# Patient Record
Sex: Male | Born: 1970 | Race: White | Hispanic: No | Marital: Married | Smoking: Current every day smoker
Health system: Southern US, Community
[De-identification: ages and names within clinical notes are randomized; demographics above are authoritative.]

## PROBLEM LIST (undated history)

## (undated) DIAGNOSIS — K219 Gastro-esophageal reflux disease without esophagitis: Secondary | ICD-10-CM

## (undated) DIAGNOSIS — M1990 Unspecified osteoarthritis, unspecified site: Secondary | ICD-10-CM

## (undated) DIAGNOSIS — J189 Pneumonia, unspecified organism: Secondary | ICD-10-CM

## (undated) DIAGNOSIS — E7800 Pure hypercholesterolemia, unspecified: Secondary | ICD-10-CM

## (undated) HISTORY — PX: APPENDECTOMY: SHX54

## (undated) HISTORY — DX: Pure hypercholesterolemia, unspecified: E78.00

## (undated) HISTORY — DX: Gastro-esophageal reflux disease without esophagitis: K21.9

## (undated) HISTORY — PX: OTHER SURGICAL HISTORY: SHX169

---

## 2007-10-14 ENCOUNTER — Ambulatory Visit (HOSPITAL_COMMUNITY): Admission: RE | Admit: 2007-10-14 | Discharge: 2007-10-14 | Payer: Self-pay | Admitting: Family Medicine

## 2007-11-11 ENCOUNTER — Ambulatory Visit (HOSPITAL_COMMUNITY): Admission: RE | Admit: 2007-11-11 | Discharge: 2007-11-11 | Payer: Self-pay | Admitting: Internal Medicine

## 2009-02-04 ENCOUNTER — Ambulatory Visit (HOSPITAL_COMMUNITY): Admission: RE | Admit: 2009-02-04 | Discharge: 2009-02-04 | Payer: Self-pay | Admitting: Family Medicine

## 2011-03-13 ENCOUNTER — Ambulatory Visit: Payer: Self-pay | Admitting: Physical Medicine & Rehabilitation

## 2011-03-13 ENCOUNTER — Encounter: Payer: 59 | Attending: Physical Medicine & Rehabilitation

## 2011-03-13 DIAGNOSIS — M5137 Other intervertebral disc degeneration, lumbosacral region: Secondary | ICD-10-CM

## 2011-03-13 DIAGNOSIS — M542 Cervicalgia: Secondary | ICD-10-CM | POA: Insufficient documentation

## 2011-03-13 DIAGNOSIS — Z79899 Other long term (current) drug therapy: Secondary | ICD-10-CM | POA: Insufficient documentation

## 2011-03-13 DIAGNOSIS — M545 Low back pain, unspecified: Secondary | ICD-10-CM | POA: Insufficient documentation

## 2011-03-13 DIAGNOSIS — F17200 Nicotine dependence, unspecified, uncomplicated: Secondary | ICD-10-CM | POA: Insufficient documentation

## 2011-03-13 DIAGNOSIS — M533 Sacrococcygeal disorders, not elsewhere classified: Secondary | ICD-10-CM

## 2011-03-17 NOTE — Consult Note (Signed)
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 He has a secondary complaint of neck pain.  He has been evaluated by Neurosurgery, they have done some type of injection where he had a single shot in his back on several occasions, which was not helpful for him.  Surgery was discussed but not strongly endorsed.  He has seen a chiropractor which has provided him some temporary relief.  He complains of 8/10 pain, which is constant.  He states that his pain has worsened with prolonged sitting.  He tries to get by without medications, in fact he does not even take one hydrocodone per day, he may take a half or one couple of days a week when his pain gets really bad while driving.  He does not seem to have any drowsiness with this medication.  He complains of numbness, weakness, tingling, spasms, easy bleeding, limb swelling, sleep apnea.  He has a past history significant for ulcers, hyperlipidemia.  CURRENT MEDICATIONS:  Hydrocodone 10/500, he takes less than 1 tablet per day, he filled these on February 06, 2011, has 47 left, meloxicam 15 mg a day, Nexium 40 mg a day, Ambien 10 mg p.r.n., cyclobenzaprine 10 mg at bedtime p.r.n., simvastatin 20 mg per day.  He takes additional supplements including potassium, garlic, vitamin D, fish oil, and multivitamin.  Alcohol use of 12-pack per week.  He smokes one half packs per day. Lives with his wife and children.  No illegal drug use reported.  FAMILY HISTORY:  Heart disease, diabetes.  FAMILY HISTORY:  Leukemia.  PHYSICAL EXAMINATION:  VITAL SIGNS:  Blood pressure, 21/67, pulse 86, respirations 18, O2 sat 97% on room air. GENERAL:  Well-developed, well-nourished male in no acute distress. Orientation x3.  Affect alert.  Gait is normal. EXTREMITIES:  Without edema. NEUROLOGIC:  Coordination normal.  Deep tendon reflexes normal in upper and lower  extremities.  Sensation is normal in the upper and lower extremities.  Neck range of motion is full and his lumbar spine range of motion is about 50% range forward flexion, 75% extension, 75% lateral rotation and bending.  His worse position is forward flexed causing the most pain.  Straight leg raising test is negative.  Hip, knee, and ankle range of motion are full.  He does have positive FABER bilaterally. Referring pain towards the PSIS.  IMPRESSION:  Lumbar pain, likely multifactorial.  No radicular component, therefore epidural steroid injections are likely to fail.  I do think he has disk related pain but also sacroiliac mediated pain.  PLAN: 1. We will send to therapy for lumbar extension exercises and     stretching. 2. The patient has TENS home unit, can continue that. 3. Send him off for sacroiliac injections under fluoroscopic guidance     bilateral. 4. Continue current medications.  Discussed with the patient, agrees     with plan.     Erick Colace, M.D. Electronically Signed    AEK/MedQ D:03/13/2011 15:17:49  T:03/14/2011 00:54:15  Job #:  [id number]  cc:   Corrie Mckusick, M.D. Fax: [phone]

## 2011-03-30 ENCOUNTER — Ambulatory Visit (HOSPITAL_COMMUNITY): Payer: 59 | Admitting: Physical Therapy

## 2011-04-16 ENCOUNTER — Ambulatory Visit: Payer: 59 | Admitting: Physical Medicine & Rehabilitation

## 2011-04-20 ENCOUNTER — Ambulatory Visit: Payer: 59

## 2011-04-20 ENCOUNTER — Ambulatory Visit: Payer: 59 | Admitting: Physical Medicine & Rehabilitation

## 2011-04-20 ENCOUNTER — Encounter: Payer: 59 | Attending: Physical Medicine & Rehabilitation

## 2011-04-20 DIAGNOSIS — M542 Cervicalgia: Secondary | ICD-10-CM | POA: Insufficient documentation

## 2011-04-20 DIAGNOSIS — M545 Low back pain, unspecified: Secondary | ICD-10-CM | POA: Insufficient documentation

## 2011-04-20 DIAGNOSIS — F17200 Nicotine dependence, unspecified, uncomplicated: Secondary | ICD-10-CM | POA: Insufficient documentation

## 2011-04-20 DIAGNOSIS — Z79899 Other long term (current) drug therapy: Secondary | ICD-10-CM | POA: Insufficient documentation

## 2012-09-15 ENCOUNTER — Other Ambulatory Visit (HOSPITAL_COMMUNITY): Payer: Self-pay | Admitting: Physical Medicine and Rehabilitation

## 2012-09-15 DIAGNOSIS — IMO0002 Reserved for concepts with insufficient information to code with codable children: Secondary | ICD-10-CM

## 2012-09-15 DIAGNOSIS — M545 Low back pain: Secondary | ICD-10-CM

## 2012-09-19 ENCOUNTER — Ambulatory Visit (HOSPITAL_COMMUNITY)
Admission: RE | Admit: 2012-09-19 | Discharge: 2012-09-19 | Disposition: A | Discharge status: Home / Self Care | Payer: 59 | Source: Ambulatory Visit | Attending: Physical Medicine and Rehabilitation | Admitting: Physical Medicine and Rehabilitation

## 2012-09-19 ENCOUNTER — Other Ambulatory Visit (HOSPITAL_COMMUNITY): Payer: Self-pay | Admitting: Physical Medicine and Rehabilitation

## 2012-09-19 DIAGNOSIS — M545 Low back pain, unspecified: Secondary | ICD-10-CM | POA: Insufficient documentation

## 2012-09-19 DIAGNOSIS — M5126 Other intervertebral disc displacement, lumbar region: Secondary | ICD-10-CM | POA: Insufficient documentation

## 2012-09-19 DIAGNOSIS — Z01818 Encounter for other preprocedural examination: Secondary | ICD-10-CM

## 2012-09-19 DIAGNOSIS — IMO0002 Reserved for concepts with insufficient information to code with codable children: Secondary | ICD-10-CM | POA: Insufficient documentation

## 2012-09-19 DIAGNOSIS — R209 Unspecified disturbances of skin sensation: Secondary | ICD-10-CM | POA: Insufficient documentation

## 2014-05-23 ENCOUNTER — Encounter (INDEPENDENT_AMBULATORY_CARE_PROVIDER_SITE_OTHER): Payer: Self-pay | Admitting: *Deleted

## 2014-05-25 ENCOUNTER — Encounter (INDEPENDENT_AMBULATORY_CARE_PROVIDER_SITE_OTHER): Payer: Self-pay | Admitting: Internal Medicine

## 2014-05-25 ENCOUNTER — Telehealth (INDEPENDENT_AMBULATORY_CARE_PROVIDER_SITE_OTHER): Payer: Self-pay | Admitting: *Deleted

## 2014-05-25 ENCOUNTER — Other Ambulatory Visit (INDEPENDENT_AMBULATORY_CARE_PROVIDER_SITE_OTHER): Payer: Self-pay | Admitting: *Deleted

## 2014-05-25 ENCOUNTER — Ambulatory Visit (INDEPENDENT_AMBULATORY_CARE_PROVIDER_SITE_OTHER): Payer: 59 | Admitting: Internal Medicine

## 2014-05-25 VITALS — BP 100/58 | HR 72 | Temp 97.8°F | Ht 73.0 in | Wt 196.3 lb

## 2014-05-25 DIAGNOSIS — K219 Gastro-esophageal reflux disease without esophagitis: Secondary | ICD-10-CM | POA: Insufficient documentation

## 2014-05-25 DIAGNOSIS — K625 Hemorrhage of anus and rectum: Secondary | ICD-10-CM

## 2014-05-25 DIAGNOSIS — Z1211 Encounter for screening for malignant neoplasm of colon: Secondary | ICD-10-CM

## 2014-05-25 DIAGNOSIS — E7800 Pure hypercholesterolemia, unspecified: Secondary | ICD-10-CM

## 2014-05-25 LAB — CBC WITH DIFFERENTIAL/PLATELET
BASOS PCT: 1 % (ref 0–1)
Basophils Absolute: 0.1 10*3/uL (ref 0.0–0.1)
EOS ABS: 0.6 10*3/uL (ref 0.0–0.7)
EOS PCT: 7 % — AB (ref 0–5)
HEMATOCRIT: 43.6 % (ref 39.0–52.0)
HEMOGLOBIN: 15.1 g/dL (ref 13.0–17.0)
LYMPHS ABS: 2.9 10*3/uL (ref 0.7–4.0)
LYMPHS PCT: 32 % (ref 12–46)
MCH: 32.1 pg (ref 26.0–34.0)
MCHC: 34.6 g/dL (ref 30.0–36.0)
MCV: 92.8 fL (ref 78.0–100.0)
Monocytes Absolute: 0.8 10*3/uL (ref 0.1–1.0)
Monocytes Relative: 9 % (ref 3–12)
NEUTROS PCT: 51 % (ref 43–77)
Neutro Abs: 4.7 10*3/uL (ref 1.7–7.7)
Platelets: 313 10*3/uL (ref 150–400)
RBC: 4.7 MIL/uL (ref 4.22–5.81)
RDW: 13.8 % (ref 11.5–15.5)
WBC: 9.2 10*3/uL (ref 4.0–10.5)

## 2014-05-25 MED ORDER — OMEPRAZOLE 40 MG PO CPDR: 40.0000 mg | DELAYED_RELEASE_CAPSULE | Freq: Every day | ORAL | Status: DC

## 2014-05-25 MED ORDER — PEG-KCL-NACL-NASULF-NA ASC-C 100 G PO SOLR: 1.0000 | Freq: Once | ORAL | Status: DC

## 2014-05-25 NOTE — Patient Instructions (Signed)
Colonoscopy/EGD. The risks and benefits such as perforation, bleeding, and infection were reviewed with the patient and is agreeable. 

## 2014-05-25 NOTE — Telephone Encounter (Signed)
Patient needs movi prep 

## 2014-05-25 NOTE — Progress Notes (Signed)
Subjective:     Patient ID: Noah Cherry, male   DOB: 07-04-1971, 43 y.o.   MRN: [id number]  HPI [facility] for rectal bleeding. He has had rectal bleeding x 7-8 months. He tells me the commode water will turn red. In the past month the bleeding has not been as bad. BMs are brown in color. Normal caliber. He c/o some abdominal pain all the time. He says sometimes the pain is so bad, it will take his breath.  He points to his mid abdomen and lower abdomen.  He is on c-pap at night. When he gets up in the morning his abdomen is hard as a rock. Appetite for the most part good. No weight loss. No dysphagia. He has acid reflux daily and Nexium helps.  He has acid reflux daily. His wife states years ago patient had stomach ulcers. Has tried Protonix in the past, but did not help.   Review of Systems  Past Medical History  Diagnosis Date  . High cholesterol   . GERD (gastroesophageal reflux disease)     Past Surgical History  Procedure Laterality Date  . Appendectomy    . Left arm surgery      laceration    No Known Allergies  No current outpatient prescriptions on file prior to visit.   No current facility-administered medications on file prior to visit.   Current Outpatient Prescriptions  Medication Sig Dispense Refill  . cholecalciferol (VITAMIN D) 400 UNITS TABS tablet Take 400 Units by mouth.      . cyanocobalamin 500 MCG tablet Take 500 mcg by mouth daily.      Marland Kitchen. esomeprazole (NEXIUM) 40 MG capsule Take 20 mg by mouth 2 (two) times daily before a meal.       . Multiple Vitamin (MULTIVITAMIN) tablet Take 1 tablet by mouth daily.      . potassium & sodium phosphates (PHOS-NAK) 280-160-250 MG PACK Take by mouth daily.       No current facility-administered medications for this visit.         Objective:   Physical Exam  Filed Vitals:   05/25/14 1013  BP: 100/58  Pulse: 72  Temp: 97.8 F (36.6 C)  Height: 6\' 1"  (1.854 m)   Weight: 196 lb 4.8 oz (89.041 kg)   Alert and oriented. Skin warm and dry. Oral mucosa is moist.   . Sclera anicteric, conjunctivae is pink. Thyroid not enlarged. No cervical lymphadenopathy. Lungs clear. Heart regular rate and rhythm.  Abdomen is soft. Appears slightly distended. Bowel sounds are positive. No hepatomegaly. No abdominal masses felt. Tenderness across mid abdomen.   No edema to lower extremities.  Stool brown and guaiac positive.     Assessment:    Rectal bleeding. Colonic neoplasm needs to be ruled out. Polyp, AVM, Diveticula also in the differential    GERD: really not controlled at this time. Plan:    Colonoscopy/EGD.The risks and benefits such as perforation, bleeding, and infection were reviewed with the patient and is agreeable. Rx for Omperazole 40mg  BID.  CBC today.

## 2014-06-01 ENCOUNTER — Encounter (HOSPITAL_COMMUNITY)
Admission: RE | Disposition: A | Discharge status: Home / Self Care | Payer: Self-pay | Source: Ambulatory Visit | Attending: Internal Medicine

## 2014-06-01 ENCOUNTER — Ambulatory Visit (HOSPITAL_COMMUNITY)
Admission: RE | Admit: 2014-06-01 | Discharge: 2014-06-01 | Disposition: A | Discharge status: Home / Self Care | Payer: 59 | Source: Ambulatory Visit | Attending: Internal Medicine | Admitting: Internal Medicine

## 2014-06-01 ENCOUNTER — Ambulatory Visit (INDEPENDENT_AMBULATORY_CARE_PROVIDER_SITE_OTHER): Payer: 59 | Admitting: Internal Medicine

## 2014-06-01 ENCOUNTER — Encounter (HOSPITAL_COMMUNITY): Payer: Self-pay | Admitting: *Deleted

## 2014-06-01 DIAGNOSIS — K644 Residual hemorrhoidal skin tags: Secondary | ICD-10-CM

## 2014-06-01 DIAGNOSIS — K219 Gastro-esophageal reflux disease without esophagitis: Secondary | ICD-10-CM | POA: Insufficient documentation

## 2014-06-01 DIAGNOSIS — K625 Hemorrhage of anus and rectum: Secondary | ICD-10-CM

## 2014-06-01 DIAGNOSIS — Z79899 Other long term (current) drug therapy: Secondary | ICD-10-CM | POA: Insufficient documentation

## 2014-06-01 DIAGNOSIS — K648 Other hemorrhoids: Secondary | ICD-10-CM

## 2014-06-01 DIAGNOSIS — R1013 Epigastric pain: Secondary | ICD-10-CM | POA: Insufficient documentation

## 2014-06-01 DIAGNOSIS — F17200 Nicotine dependence, unspecified, uncomplicated: Secondary | ICD-10-CM | POA: Insufficient documentation

## 2014-06-01 HISTORY — PX: ESOPHAGOGASTRODUODENOSCOPY: SHX5428

## 2014-06-01 HISTORY — PX: COLONOSCOPY: SHX5424

## 2014-06-01 SURGERY — COLONOSCOPY
Anesthesia: Moderate Sedation

## 2014-06-01 MED ORDER — MEPERIDINE HCL 50 MG/ML IJ SOLN
INTRAMUSCULAR | Status: DC | PRN
Start: 2014-06-01 — End: 2014-06-01
  Administered 2014-06-01 (×2): 25 mg via INTRAVENOUS

## 2014-06-01 MED ORDER — MIDAZOLAM HCL 5 MG/5ML IJ SOLN
INTRAMUSCULAR | Status: AC
  Filled 2014-06-01: qty 10

## 2014-06-01 MED ORDER — BUTAMBEN-TETRACAINE-BENZOCAINE 2-2-14 % EX AERO
INHALATION_SPRAY | CUTANEOUS | Status: DC | PRN
  Administered 2014-06-01: 2 via TOPICAL

## 2014-06-01 MED ORDER — MEPERIDINE HCL 50 MG/ML IJ SOLN
INTRAMUSCULAR | Status: AC
  Filled 2014-06-01: qty 1

## 2014-06-01 MED ORDER — DICYCLOMINE HCL 10 MG PO CAPS: 10.0000 mg | ORAL_CAPSULE | Freq: Two times a day (BID) | ORAL | Status: AC

## 2014-06-01 MED ORDER — SODIUM CHLORIDE 0.9 % IV SOLN
INTRAVENOUS | Status: DC
  Administered 2014-06-01: 13:00:00 via INTRAVENOUS

## 2014-06-01 MED ORDER — MIDAZOLAM HCL 5 MG/5ML IJ SOLN
INTRAMUSCULAR | Status: DC | PRN
  Administered 2014-06-01: 3 mg via INTRAVENOUS
  Administered 2014-06-01 (×3): 2 mg via INTRAVENOUS

## 2014-06-01 MED ORDER — STERILE WATER FOR IRRIGATION IR SOLN
Status: DC | PRN
  Administered 2014-06-01: 14:00:00

## 2014-06-01 NOTE — Discharge Instructions (Signed)
Resume usual medications and high fiber diet. Dicyclomine 10 mg by mouth 15 to 30 minutes before breakfast and lunch daily. Fiber supplement 4 g by mouth daily at bedtime. Keep record as to frequency of bleeding episodes until office visit in 2 months.  High-Fiber Diet Fiber is found in fruits, vegetables, and grains. A high-fiber diet encourages the addition of more whole grains, legumes, fruits, and vegetables in your diet. The recommended amount of fiber for adult males is 38 g per day. For adult females, it is 25 g per day. Pregnant and lactating women should get 28 g of fiber per day. If you have a digestive or bowel problem, ask your caregiver for advice before adding high-fiber foods to your diet. Eat a variety of high-fiber foods instead of only a select few type of foods.  PURPOSE  To increase stool bulk.  To make bowel movements more regular to prevent constipation.  To lower cholesterol.  To prevent overeating. WHEN IS THIS DIET USED?  It may be used if you have constipation and hemorrhoids.  It may be used if you have uncomplicated diverticulosis (intestine condition) and irritable bowel syndrome.  It may be used if you need help with weight management.  It may be used if you want to add it to your diet as a protective measure against atherosclerosis, diabetes, and cancer. SOURCES OF FIBER  Whole-grain breads and cereals.  Fruits, such as apples, oranges, bananas, berries, prunes, and pears.  Vegetables, such as green peas, carrots, sweet potatoes, beets, broccoli, cabbage, spinach, and artichokes.  Legumes, such split peas, soy, lentils.  Almonds. FIBER CONTENT IN FOODS Starches and Grains / Dietary Fiber (g)  Cheerios, 1 cup / 3 g  Corn Flakes cereal, 1 cup / 0.7 g  Rice crispy treat cereal, 1 cup / 0.3 g  Instant oatmeal (cooked),  cup / 2 g  Frosted wheat cereal, 1 cup / 5.1 g  Brown, long-grain rice (cooked), 1 cup / 3.5 g  White, long-grain rice  (cooked), 1 cup / 0.6 g  Enriched macaroni (cooked), 1 cup / 2.5 g Legumes / Dietary Fiber (g)  Baked beans (canned, plain, or vegetarian),  cup / 5.2 g  Kidney beans (canned),  cup / 6.8 g  Pinto beans (cooked),  cup / 5.5 g Breads and Crackers / Dietary Fiber (g)  Plain or honey graham crackers, 2 squares / 0.7 g  Saltine crackers, 3 squares / 0.3 g  Plain, salted pretzels, 10 pieces / 1.8 g  Whole-wheat bread, 1 slice / 1.9 g  White bread, 1 slice / 0.7 g  Raisin bread, 1 slice / 1.2 g  Plain bagel, 3 oz / 2 g  Flour tortilla, 1 oz / 0.9 g  Corn tortilla, 1 small / 1.5 g  Hamburger or hotdog bun, 1 small / 0.9 g Fruits / Dietary Fiber (g)  Apple with skin, 1 medium / 4.4 g  Sweetened applesauce,  cup / 1.5 g  Banana,  medium / 1.5 g  Grapes, 10 grapes / 0.4 g  Orange, 1 small / 2.3 g  Raisin, 1.5 oz / 1.6 g  Melon, 1 cup / 1.4 g Vegetables / Dietary Fiber (g)  Green beans (canned),  cup / 1.3 g  Carrots (cooked),  cup / 2.3 g  Broccoli (cooked),  cup / 2.8 g  Peas (cooked),  cup / 4.4 g  Mashed potatoes,  cup / 1.6 g  Lettuce, 1 cup / 0.5 g  Corn (canned),  cup / 1.6 g  Tomato,  cup / 1.1 g Document Released: 11/30/2005 Document Revised: 05/31/2012 Document Reviewed: 03/03/2012 [facility] Patient Information 2015 [place], [place]. This information is not intended to replace advice given to you by your health care provider. Make sure you discuss any questions you have with your health care provider.   Esophagogastroduodenoscopy Care After Refer to this sheet in the next few weeks. These instructions provide you with information on caring for yourself after your procedure. Your caregiver may also give you more specific instructions. Your treatment has been planned according to current medical practices, but problems sometimes occur. Call your caregiver if you have any problems or questions after your procedure.  HOME CARE  INSTRUCTIONS  Do not eat or drink anything until the numbing medicine (local anesthetic) has worn off and your gag reflex has returned. You will know that the local anesthetic has worn off when you can swallow comfortably.  Do not drive for 12 hours after the procedure or as directed by your caregiver.  Only take medicines as directed by your caregiver. SEEK MEDICAL CARE IF:   You cannot stop coughing.  You are not urinating at all or less than usual. SEEK IMMEDIATE MEDICAL CARE IF:  You have difficulty swallowing.  You cannot eat or drink.  You have worsening throat or chest pain.  You have dizziness, lightheadedness, or you faint.  You have nausea or vomiting.  You have chills.  You have a fever.  You have severe abdominal pain.  You have black, tarry, or bloody stools. Document Released: 11/16/2012 Document Reviewed: 11/16/2012 [facility] Patient Information 2015 [place]Care, [place]. This information is not intended to replace advice given to you by your health care provider. Make sure you discuss any questions you have with your health care provider. Colonoscopy, Care After These instructions give you information on caring for yourself after your procedure. Your doctor may also give you more specific instructions. Call your doctor if you have any problems or questions after your procedure. HOME CARE  Do not drive for 24 hours.  Do not sign important papers or use machinery for 24 hours.  You may shower.  You may go back to your usual activities, but go slower for the first 24 hours.  Take rest breaks often during the first 24 hours.  Walk around or use warm packs on your belly (abdomen) if you have belly cramping or gas.  Drink enough fluids to keep your pee (urine) clear or pale yellow.  Resume your normal diet. Avoid heavy or fried foods.  Avoid drinking alcohol for 24 hours or as told by your doctor.  Only take medicines as told by your doctor. If a tissue  sample (biopsy) was taken during the procedure:   Do not take aspirin or blood thinners for 7 days, or as told by your doctor.  Do not drink alcohol for 7 days, or as told by your doctor.  Eat soft foods for the first 24 hours. GET HELP IF: You still have a small amount of blood in your poop (stool) 2-3 days after the procedure. GET HELP RIGHT AWAY IF:  You have more than a small amount of blood in your poop.  You see clumps of tissue (blood clots) in your poop.  Your belly is puffy (swollen).  You feel sick to your stomach (nauseous) or throw up (vomit).  You have a fever.  You have belly pain that gets worse and medicine does not help.  MAKE SURE YOU:  Understand these instructions.  Will watch your condition.  Will get help right away if you are not doing well or get worse. Document Released: 01/02/2011 Document Revised: 12/05/2013 Document Reviewed: 08/07/2013 [facility]Care Patient Information 2015 [place] LakeExitCare, [place]LLC. This information is not intended to replace advice given to you by your health care provider. Make sure you discuss any questions you have with your health care provider.

## 2014-06-01 NOTE — H&P (Signed)
Noah CapriceJesse D Cherry is an 43 y.o. male.   Chief Complaint: Patient is here for EGD and colonoscopy. HPI: Patient is 43 year old Caucasian male who presents with daily heartburn and epigastric pain. He says he's had symptoms of GERD for 15 years. Nexium has worked the best of the symptom control and has not been to his satisfaction. He was recently seen in the office in switch to omeprazole which is not working. He denies nausea vomiting or melena. He has chronic low back pain and takes Aleve 2 tablets at a time 2-3 times a week. He was having more pain when he took the medication more frequently and decided to back off. He also complains of rectal bleeding which started about 8 months ago. He's had right blood with bowel movements and other times on these passes blood. He denies diarrhea or constipation. He has had intermittent discomfort across lower abdomen. Bleeding has been occurring a couple of times a month or at least once a week but has slowed down lately. Family history is negative for CRC.  Past Medical History  Diagnosis Date  . High cholesterol   . GERD (gastroesophageal reflux disease)     Past Surgical History  Procedure Laterality Date  . Appendectomy    . Left arm surgery      laceration    History reviewed. No pertinent family history. Social History:  reports that he has been smoking.  He uses smokeless tobacco. He reports that he drinks about 14.4 ounces of alcohol per week. He reports that he does not use illicit drugs.  Allergies: No Known Allergies  Medications Prior to Admission  Medication Sig Dispense Refill  . Multiple Vitamin (MULTIVITAMIN) tablet Take 1 tablet by mouth daily.      . cholecalciferol (VITAMIN D) 400 UNITS TABS tablet Take 400 Units by mouth.      . cyanocobalamin 500 MCG tablet Take 500 mcg by mouth daily.      Marland Kitchen. esomeprazole (NEXIUM) 40 MG capsule Take 20 mg by mouth 2 (two) times daily before a meal.       . omeprazole (PRILOSEC) 40 MG capsule Take  1 capsule (40 mg total) by mouth daily.  60 capsule  2    No results found for this or any previous visit (from the past 48 hour(s)). No results found.  ROS  Blood pressure 132/86, pulse 82, temperature 98 F (36.7 C), temperature source Oral, resp. rate 17, height 6\' 1"  (1.854 m), weight 196 lb (88.905 kg), SpO2 95.00%. Physical Exam  Constitutional: He appears well-developed and well-nourished.  HENT:  Mouth/Throat: Oropharynx is clear and moist.  Eyes: Conjunctivae are normal. No scleral icterus.  Neck: No thyromegaly present.  Cardiovascular: Normal rate and regular rhythm.   No murmur heard. Respiratory: Effort normal and breath sounds normal.  GI: Soft. Tenderness: mild midepigastric tenderness. xiphisternum is also tender.  Small umbilical hernia  Musculoskeletal: He exhibits no edema.  Lymphadenopathy:    He has no cervical adenopathy.  Neurological: He is alert.  Skin: Skin is warm and dry.  He has multiple tattoos including one in the right forearm and the right subclavicular area     Assessment/Plan Refractory GERD. Epigastric pain the patient chronic/intermittent NSAID use. Rectal bleeding. Diagnostic EGD and colonoscopy.  Fedora Knisely U 06/01/2014, 2:21 PM

## 2014-06-01 NOTE — Op Note (Signed)
EGD AND COLONOSCOPY PROCEDURE REPORT  PATIENT:  Noah Cherry  MR#:  [id number] Birthdate:  21-Feb-1971, 43 y.o., male Endoscopist:  Dr. Malissa HippoNajeeb U. Rehman, MD Referred By:  Dr. Colette RibasJohn Cabot Golding, MD  Procedure Date: 06/01/2014  Procedure:   EGD & Colonoscopy  Indications:  Patient is 43 year old Caucasian male with history of chronic GERD and poor symptom control also complains of recurrent epigastric pain. He also gives two month history of intermittent rectal bleeding. He has noted lower abdominal discomfort but denies diarrhea and/or constipation. He takes 2 Aleve tablets 2-3 times a week for back pain. H&H on 05/25/2014 was 15.1 and 43.6. He is undergoing diagnostic EGD and a colonoscopy.            Informed Consent:  The risks, benefits, alternatives & imponderables which include, but are not limited to, bleeding, infection, perforation, drug reaction and potential missed lesion have been reviewed.  The potential for biopsy, lesion removal, esophageal dilation, etc. have also been discussed.  Questions have been answered.  All parties agreeable.  Please see history & physical in medical record for more information.  Medications:  Demerol 50 mg IV Versed 10 mg IV Cetacaine spray topically for oropharyngeal anesthesia  EGD  Description of procedure:  The endoscope was introduced through the mouth and advanced to the second portion of the duodenum without difficulty or limitations. The mucosal surfaces were surveyed very carefully during advancement of the scope and upon withdrawal.  Findings:  Esophagus:  Mucosa of the esophagus was normal. GE junction was unremarkable. GEJ:  43 cm Stomach:  Stomach was empty. It distended very well with insufflation. Folds in the proximal stomach was normal. Examination mucosa and gastric body, antrum, pyloric channel, angularis fundus and cardia was normal. Duodenum:  Normal bulbar and post bulbar mucosa.  Therapeutic/Diagnostic Maneuvers Performed:   None  COLONOSCOPY Description of procedure:  After a digital rectal exam was performed, that colonoscope was advanced from the anus through the rectum and colon to the area of the cecum, ileocecal valve and appendiceal orifice. The cecum was deeply intubated. These structures were well-seen and photographed for the record. From the level of the cecum and ileocecal valve, the scope was slowly and cautiously withdrawn. The mucosal surfaces were carefully surveyed utilizing scope tip to flexion to facilitate fold flattening as needed. The scope was pulled down into the rectum where a thorough exam including retroflexion was performed. Terminal ileum was also examined.  Findings:  Prep excellent. Normal mucosa of terminal ileum. Normal mucosa of various segments of colon and rectum. Hemorrhoids noted above and below the dentate line.    Therapeutic/Diagnostic Maneuvers Performed:  None  Complications:  None  Cecal Withdrawal Time:  8 minutes  Impression:  Normal esophagogastroduodenoscopy. Normal terminal ileoscopy and colonoscopy except internal and external hemorrhoids. Internal hemorrhoids were more prominent and external hemorrhoids were small.  Recommendations:  Patient will go back on Nexium 40 mg by mouth every morning as omeprazole is not working. Dicyclomine 10 mg by mouth daily before breakfast and lunch. Fiber supplement 4 g by mouth daily. Patient will keep symptom diary as to frequency of rectal bleeding on 2 office visit in 8 weeks. If abdominal pain persists would consider abdominopelvic CT. If rectal bleeding continues he will benefit from Ultroid therapy.  REHMAN,NAJEEB U  06/01/2014 3:17 PM  CC: Dr. Phillips OdorGOLDING, Chancy HurterJOHN CABOT, MD & Dr. Bonnetta BarryNo ref. provider found

## 2014-06-04 ENCOUNTER — Telehealth (INDEPENDENT_AMBULATORY_CARE_PROVIDER_SITE_OTHER): Payer: Self-pay | Admitting: *Deleted

## 2014-06-04 NOTE — Telephone Encounter (Signed)
Per op note, patient needs OV in 8 weeks 

## 2014-06-05 NOTE — Telephone Encounter (Signed)
Apt has been scheduled for 08/10/14 with Dorene Arerri Setzer, NP.

## 2014-06-08 ENCOUNTER — Encounter (HOSPITAL_COMMUNITY): Payer: Self-pay | Admitting: Internal Medicine

## 2014-08-10 ENCOUNTER — Ambulatory Visit (INDEPENDENT_AMBULATORY_CARE_PROVIDER_SITE_OTHER): Payer: 59 | Admitting: Internal Medicine

## 2014-08-10 ENCOUNTER — Encounter (INDEPENDENT_AMBULATORY_CARE_PROVIDER_SITE_OTHER): Payer: Self-pay | Admitting: Internal Medicine

## 2014-08-10 VITALS — BP 110/68 | HR 72 | Temp 97.7°F | Ht 73.0 in | Wt 201.1 lb

## 2014-08-10 DIAGNOSIS — R1013 Epigastric pain: Secondary | ICD-10-CM | POA: Insufficient documentation

## 2014-08-10 DIAGNOSIS — G8929 Other chronic pain: Secondary | ICD-10-CM

## 2014-08-10 DIAGNOSIS — R1012 Left upper quadrant pain: Secondary | ICD-10-CM

## 2014-08-10 DIAGNOSIS — R1011 Right upper quadrant pain: Secondary | ICD-10-CM

## 2014-08-10 NOTE — Patient Instructions (Signed)
Ct abdomen/pelvis with CM.  

## 2014-08-10 NOTE — Progress Notes (Signed)
Subjective:    Patient ID: Noah Cherry, male    DOB: [birth year], 43 y.o.   MRN: [id number]  HPI Here today for f/u after recent procedures in June for rectal bleeding and uncontrolled GERD. Please seen below.  He tells me that in July he had some rectal bleeding. Last week he also had some rectal bleeding. Hx of internal an external hemorrhoids on colonoscopy. He tells me his appetite is good. He has gained 4 pounds since his visit in June.  He tells me after he eats, he feels bloated. He says he has abdominal pain, which he describes as stabbing. The pain will last 5-10 minutes. The is epigastric and mid abdominal.  He usually has 3-4 stools a day. States stools look like diarrhea. Symptoms for over a year. No nausea/vomiting, fever associated with his symptoms.  Acid reflux is controlled with the Protonix.     06/01/2014 EGD/Colonoscopy: Dr. Karilyn Cota: Impression:  Normal esophagogastroduodenoscopy.  Normal terminal ileoscopy and colonoscopy except internal and external hemorrhoids. Internal hemorrhoids were more prominent and external hemorrhoids were small.  Recommendations:  Patient will go back on Nexium 40 mg by mouth every morning as omeprazole is not working.  Dicyclomine 10 mg by mouth daily before breakfast and lunch.  Fiber supplement 4 g by mouth daily.  Patient will keep symptom diary as to frequency of rectal bleeding on 2 office visit in 8 weeks.  If abdominal pain persists would consider abdominopelvic CT.  If rectal bleeding continues he will benefit from Ultroid therapy.     Review of Systems Past Medical History  Diagnosis Date  . High cholesterol   . GERD (gastroesophageal reflux disease)     Past Surgical History  Procedure Laterality Date  . Appendectomy    . Left arm surgery      laceration  . Colonoscopy N/A 06/01/2014    Procedure: COLONOSCOPY;  Surgeon: Malissa Hippo, MD;  Location: [facility];  Service: Endoscopy;  Laterality: N/A;  100-moved to  200 Ann to notify pt  . Esophagogastroduodenoscopy N/A 06/01/2014    Procedure: ESOPHAGOGASTRODUODENOSCOPY (EGD);  Surgeon: Malissa Hippo, MD;  Location: [facility];  Service: Endoscopy;  Laterality: N/A;    No Known Allergies  Current Outpatient Prescriptions on File Prior to Visit  Medication Sig Dispense Refill  . cholecalciferol (VITAMIN D) 400 UNITS TABS tablet Take 400 Units by mouth.      . cyanocobalamin 500 MCG tablet Take 500 mcg by mouth daily.      Marland Kitchen dicyclomine (BENTYL) 10 MG capsule Take 1 capsule (10 mg total) by mouth 2 (two) times daily before a meal.  60 capsule  5  . Multiple Vitamin (MULTIVITAMIN) tablet Take 1 tablet by mouth daily.       No current facility-administered medications on file prior to visit.        Objective:   Physical Exam  Filed Vitals:   08/10/14 0901  BP: 110/68  Pulse: 72  Temp: 97.7 F (36.5 C)  Height:  (1.854 m)  Weight: 201 lb 1.6 oz (91.218 kg)   Alert and oriented. Skin warm and dry. Oral mucosa is moist.   . Sclera anicteric, conjunctivae is pink. Thyroid not enlarged. No cervical lymphadenopathy. Lungs clear. Heart regular rate and rhythm.  Abdomen is soft. Bowel sounds are positive. No hepatomegaly. No abdominal masses felt. Epigastric tenderness.  No edema to lower extremities.          Assessment & Plan:  Epigastric pain: Normal EGD. Will proceed with a CT abdomen/pelvis to rule out other pathology

## 2014-08-17 ENCOUNTER — Ambulatory Visit (HOSPITAL_COMMUNITY): Payer: 59

## 2014-08-24 ENCOUNTER — Encounter (HOSPITAL_COMMUNITY): Payer: Self-pay

## 2014-08-24 ENCOUNTER — Ambulatory Visit (HOSPITAL_COMMUNITY)
Admission: RE | Admit: 2014-08-24 | Discharge: 2014-08-24 | Disposition: A | Discharge status: Home / Self Care | Payer: 59 | Source: Ambulatory Visit | Attending: Internal Medicine | Admitting: Internal Medicine

## 2014-08-24 DIAGNOSIS — R197 Diarrhea, unspecified: Secondary | ICD-10-CM | POA: Diagnosis not present

## 2014-08-24 DIAGNOSIS — R109 Unspecified abdominal pain: Secondary | ICD-10-CM | POA: Diagnosis present

## 2014-08-24 DIAGNOSIS — R1011 Right upper quadrant pain: Secondary | ICD-10-CM

## 2014-08-24 DIAGNOSIS — K625 Hemorrhage of anus and rectum: Secondary | ICD-10-CM | POA: Diagnosis not present

## 2014-08-24 DIAGNOSIS — R1012 Left upper quadrant pain: Secondary | ICD-10-CM

## 2014-08-24 DIAGNOSIS — R933 Abnormal findings on diagnostic imaging of other parts of digestive tract: Secondary | ICD-10-CM | POA: Diagnosis not present

## 2014-08-24 DIAGNOSIS — R1013 Epigastric pain: Secondary | ICD-10-CM

## 2014-08-24 DIAGNOSIS — G8929 Other chronic pain: Secondary | ICD-10-CM

## 2014-08-24 MED ORDER — IOHEXOL 300 MG/ML  SOLN
100.0000 mL | Freq: Once | INTRAMUSCULAR | Status: AC | PRN
  Administered 2014-08-24: 100 mL via INTRAVENOUS

## 2014-08-27 ENCOUNTER — Telehealth (INDEPENDENT_AMBULATORY_CARE_PROVIDER_SITE_OTHER): Payer: Self-pay | Admitting: Internal Medicine

## 2014-08-27 DIAGNOSIS — K625 Hemorrhage of anus and rectum: Secondary | ICD-10-CM

## 2014-08-27 MED ORDER — HYDROCORTISONE ACE-PRAMOXINE 1-1 % RE FOAM: 1.0000 | Freq: Two times a day (BID) | RECTAL | Status: AC

## 2014-08-27 NOTE — Telephone Encounter (Signed)
Rx sent to pharmacy   

## 2015-01-26 ENCOUNTER — Other Ambulatory Visit (INDEPENDENT_AMBULATORY_CARE_PROVIDER_SITE_OTHER): Payer: Self-pay | Admitting: Internal Medicine

## 2015-10-25 IMAGING — CT CT ABD-PELV W/ CM
2 of 5 series · 17 of 46 positions shown, 19 images · IV contrast (Omnipaque 300)
Comparison: none

CLINICAL DATA: Pain.  Rectal bleeding.  Diarrhea.

EXAM:
CT ABDOMEN AND PELVIS WITH CONTRAST
TECHNIQUE: Multidetector CT imaging of the abdomen and pelvis was performed
using the standard protocol following bolus administration of
intravenous contrast.
CONTRAST:  100mL OMNIPAQUE IOHEXOL 300 MG/ML  SOLN

[Series 2: abd_pel_with 5.0 b40f · axial · 0.69mm/px · z∈[+1348,+1748]mm · 14 of 90 slices shown, 16 images]
[im 5/90  soft-tissue]
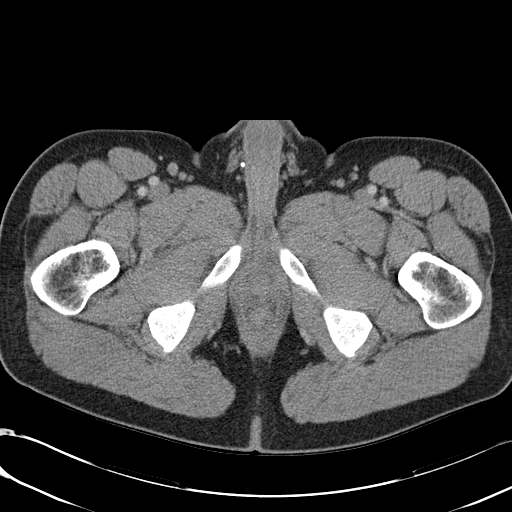
[im 5/90  bone]
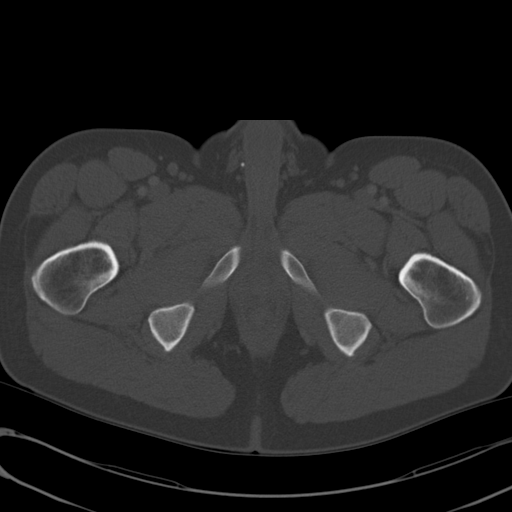
[im 10/90  soft-tissue]
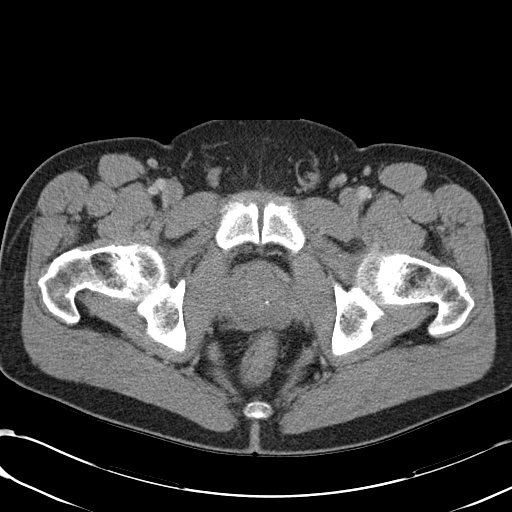
[im 20/90  soft-tissue]
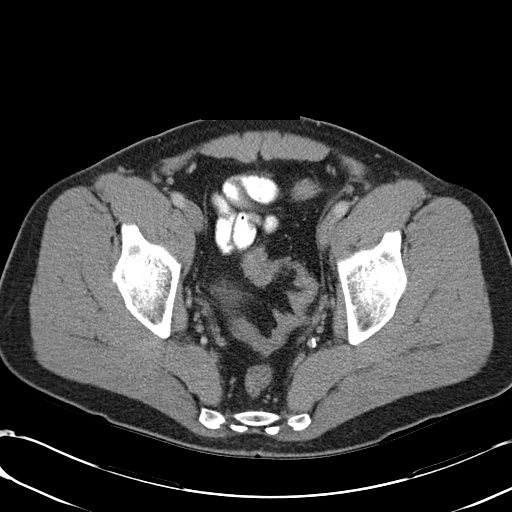
[im 25/90  soft-tissue]
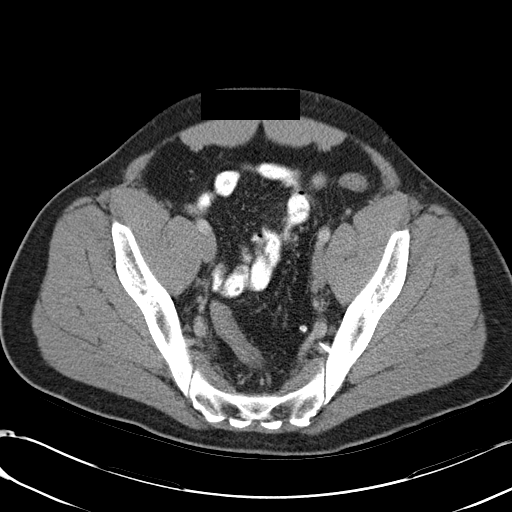
[im 30/90  soft-tissue]
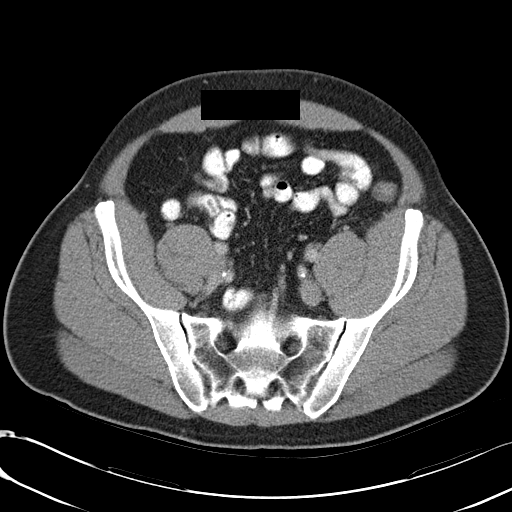
[im 35/90  soft-tissue]
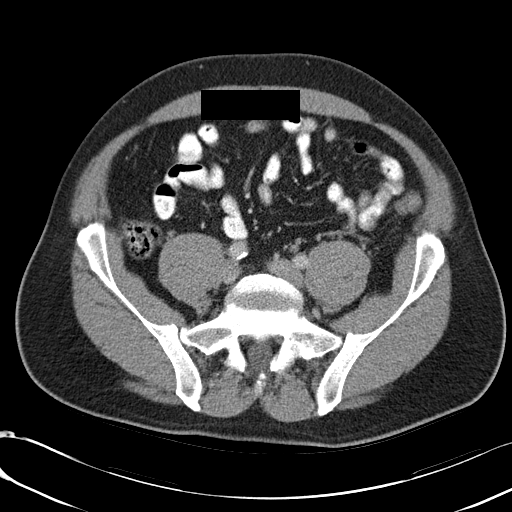
[im 40/90  soft-tissue]
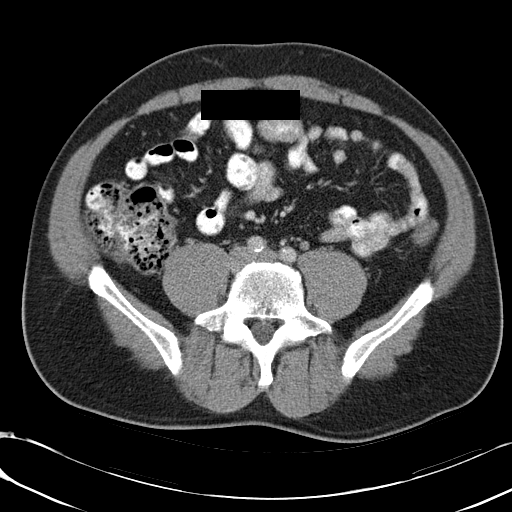
[im 50/90  soft-tissue]
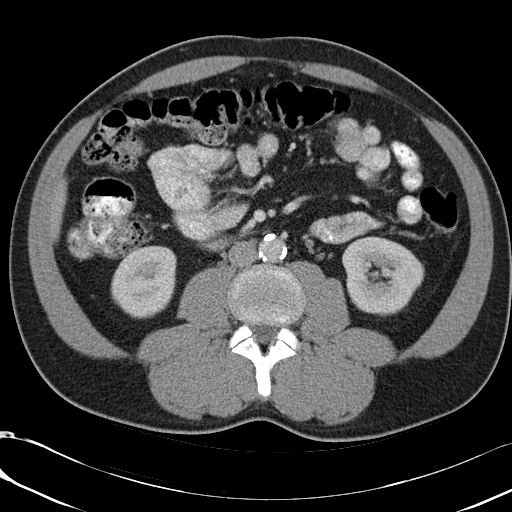
[im 55/90  soft-tissue]
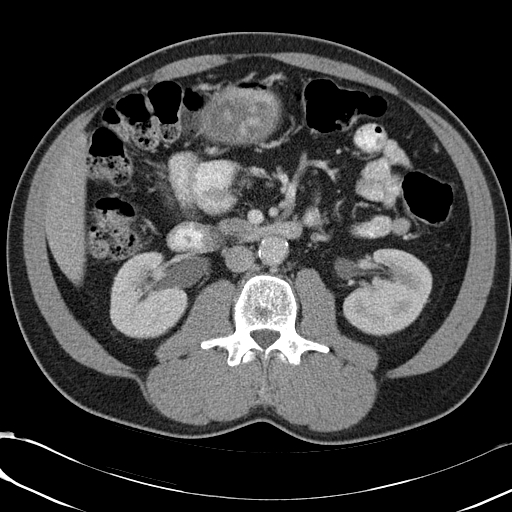
[im 55/90  bone]
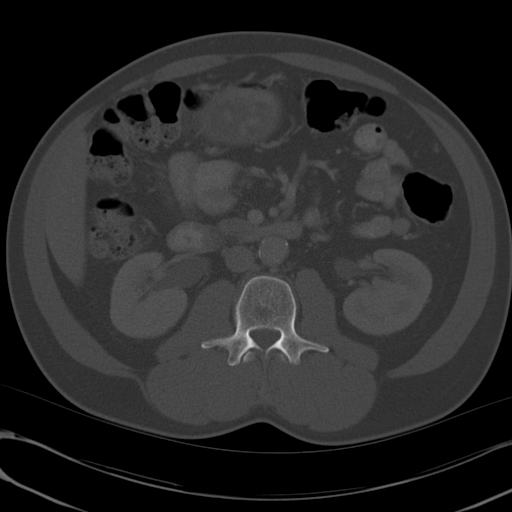
[im 60/90  soft-tissue]
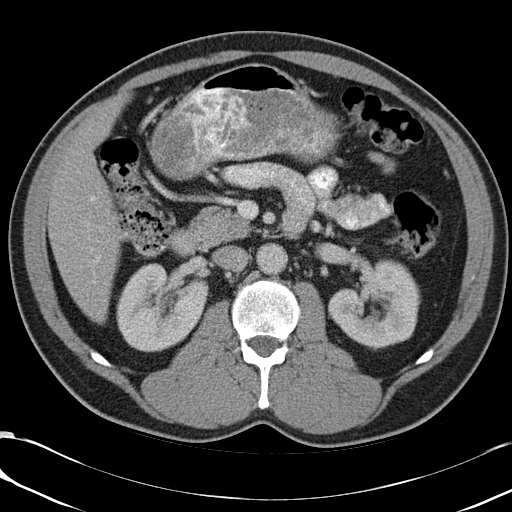
[im 65/90  soft-tissue]
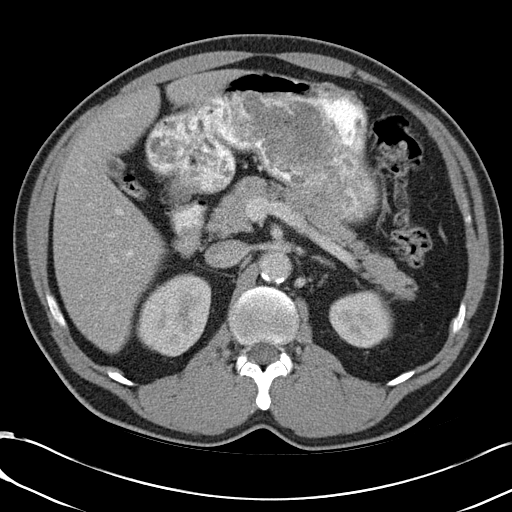
[im 70/90  soft-tissue]
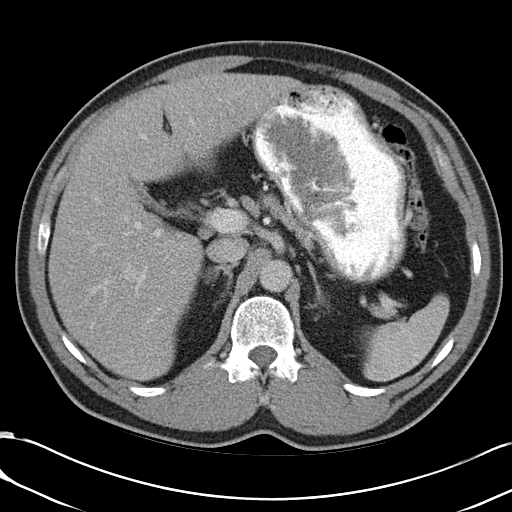
[im 80/90  soft-tissue]
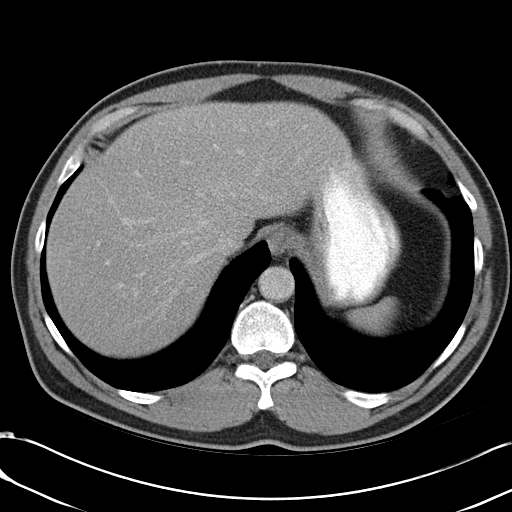
[im 85/90  soft-tissue]
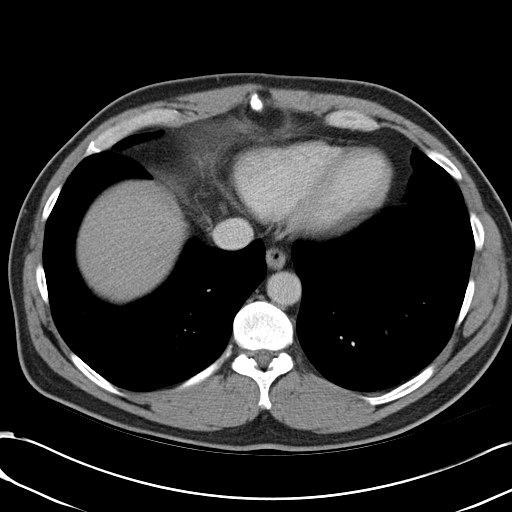

[Series 5: abd_pel_with 3.0 spo cor · coronal · 0.72mm/px · 3 of 95 slices shown]
[im 32/95  soft-tissue]
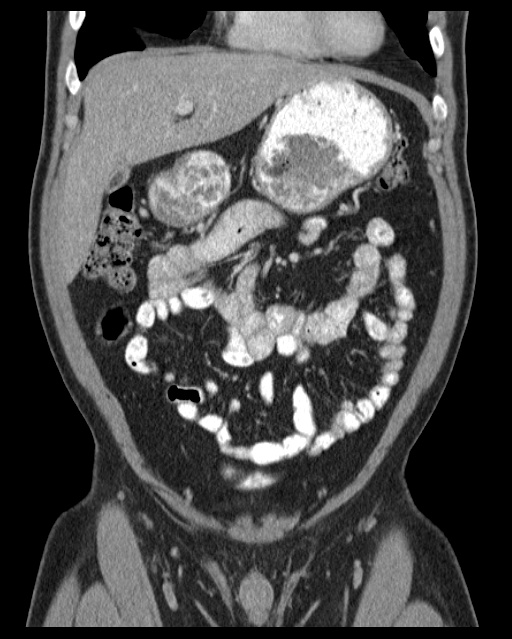
[im 42/95  soft-tissue]
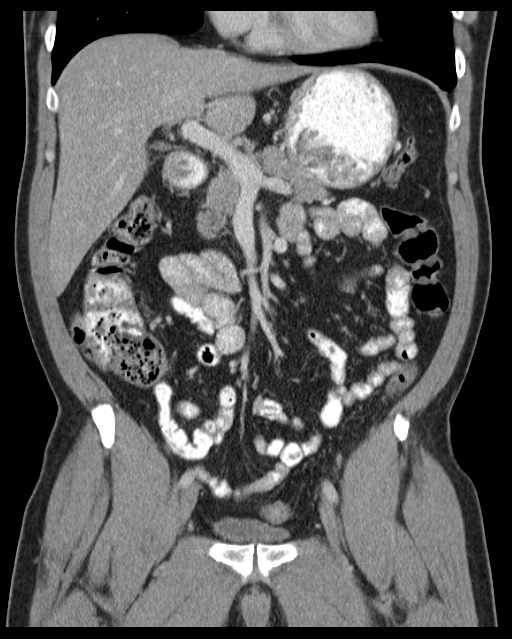
[im 53/95  soft-tissue]
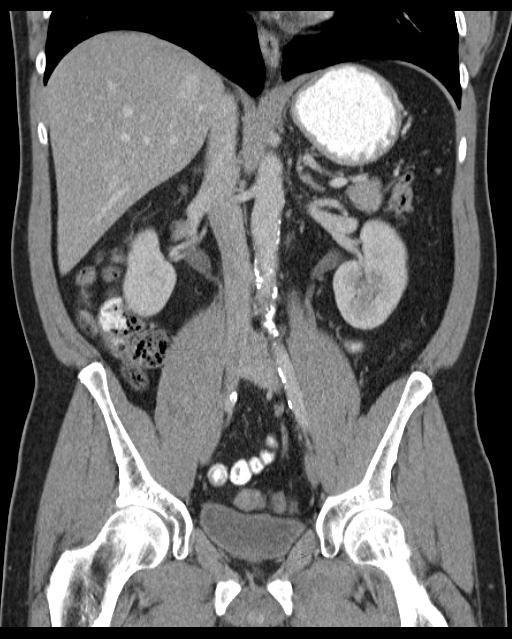

[17 of 46 positions shown; findings below may reference images not displayed]

FINDINGS: Liver normal. Spleen normal. Pancreas normal. No biliary distention.
Gallbladder is contracted.

Adrenal normal. Kidneys normal. No hydronephrosis or obstructing
ureteral stone. Bladder is nondistended.

No significant adenopathy. Aortoiliac atherosclerotic vascular
disease. No aneurysm. Portal vein patent.

Appendectomy. No bowel distention. Stomach is distended with
contrast and debris. No free air or mesenteric mass. Tiny umbilical
hernia with herniation of fat only. Tiny left inguinal hernia
herniation of fat only.

Heart size normal. Mild lung base atelectasis and/or scarring. No
acute bony abnormality.
IMPRESSION: 1. Mild rectosigmoid wall thickening. This may be from collapsed
state of bowel. Mild colitis cannot be excluded.

2. No acute abnormality otherwise noted.

## 2016-02-25 ENCOUNTER — Other Ambulatory Visit (INDEPENDENT_AMBULATORY_CARE_PROVIDER_SITE_OTHER): Payer: Self-pay | Admitting: Internal Medicine

## 2016-03-28 ENCOUNTER — Other Ambulatory Visit (INDEPENDENT_AMBULATORY_CARE_PROVIDER_SITE_OTHER): Payer: Self-pay | Admitting: Internal Medicine

## 2016-06-08 ENCOUNTER — Other Ambulatory Visit (INDEPENDENT_AMBULATORY_CARE_PROVIDER_SITE_OTHER): Payer: Self-pay | Admitting: Internal Medicine

## 2016-07-13 ENCOUNTER — Other Ambulatory Visit (INDEPENDENT_AMBULATORY_CARE_PROVIDER_SITE_OTHER): Payer: Self-pay | Admitting: Internal Medicine

## 2017-02-02 ENCOUNTER — Other Ambulatory Visit (INDEPENDENT_AMBULATORY_CARE_PROVIDER_SITE_OTHER): Payer: Self-pay | Admitting: Internal Medicine

## 2017-02-03 ENCOUNTER — Other Ambulatory Visit (INDEPENDENT_AMBULATORY_CARE_PROVIDER_SITE_OTHER): Payer: Self-pay | Admitting: Internal Medicine

## 2017-02-03 NOTE — Telephone Encounter (Signed)
error 

## 2019-09-26 DIAGNOSIS — G4733 Obstructive sleep apnea (adult) (pediatric): Secondary | ICD-10-CM | POA: Insufficient documentation

## 2019-09-26 DIAGNOSIS — S93431A Sprain of tibiofibular ligament of right ankle, initial encounter: Secondary | ICD-10-CM | POA: Insufficient documentation

## 2019-09-26 DIAGNOSIS — S82861A Displaced Maisonneuve's fracture of right leg, initial encounter for closed fracture: Secondary | ICD-10-CM | POA: Insufficient documentation

## 2019-09-26 DIAGNOSIS — S82831A Other fracture of upper and lower end of right fibula, initial encounter for closed fracture: Secondary | ICD-10-CM | POA: Insufficient documentation

## 2019-10-18 DIAGNOSIS — M6751 Plica syndrome, right knee: Secondary | ICD-10-CM | POA: Insufficient documentation

## 2019-10-18 DIAGNOSIS — S83241A Other tear of medial meniscus, current injury, right knee, initial encounter: Secondary | ICD-10-CM | POA: Insufficient documentation

## 2019-12-15 HISTORY — PX: OTHER SURGICAL HISTORY: SHX169

## 2022-11-24 ENCOUNTER — Telehealth: Payer: Self-pay | Admitting: Radiology

## 2022-11-24 NOTE — Telephone Encounter (Signed)
Patient scheduled appointment with Dr Romeo Apple I called to see if he has been treated elsewhere for the knee, he has gone to urgent care in [facility] they will get copy of the office note to bring in with him for the appointment, they did not do any xrays.

## 2022-11-30 ENCOUNTER — Ambulatory Visit (INDEPENDENT_AMBULATORY_CARE_PROVIDER_SITE_OTHER): Payer: 59

## 2022-11-30 ENCOUNTER — Ambulatory Visit: Payer: 59 | Admitting: Orthopedic Surgery

## 2022-11-30 ENCOUNTER — Encounter: Payer: Self-pay | Admitting: Orthopedic Surgery

## 2022-11-30 VITALS — BP 148/90 | HR 77 | Ht 73.0 in | Wt 209.4 lb

## 2022-11-30 DIAGNOSIS — M1711 Unilateral primary osteoarthritis, right knee: Secondary | ICD-10-CM

## 2022-11-30 DIAGNOSIS — S83241A Other tear of medial meniscus, current injury, right knee, initial encounter: Secondary | ICD-10-CM

## 2022-11-30 DIAGNOSIS — M25561 Pain in right knee: Secondary | ICD-10-CM

## 2022-11-30 NOTE — Patient Instructions (Addendum)
While we are working on your approval for MRI please go ahead and call to schedule your appointment with [facility] within at least one (1) week.   Central Scheduling [phone]  

## 2022-11-30 NOTE — Progress Notes (Signed)
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  He says he was doing some work around the house woke up the next day had medial knee pain.  [facility] they diagnosed him with quadriceps tendinitis put him on diclofenac which he took for a week.  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 to drive the truck and work the gas pedal  He has lack of confidence in the knee in terms of weightbearing  Past Medical History:  Diagnosis Date   GERD (gastroesophageal reflux disease)    High cholesterol     BP (!) 148/90   Pulse 77   Ht 6\' 1"  (1.854 m)   Wt 209 lb 6.4 oz (95 kg)   BMI 27.63 kg/m    General appearance: Well-developed well-nourished no gross deformities  Cardiovascular normal pulse and perfusion normal color without edema  Neurologically no sensation loss or deficits or pathologic reflexes  Psychological: Awake alert and oriented x3 mood and affect normal  Skin no lacerations or ulcerations no nodularity no palpable masses, no erythema or nodularity  Musculoskeletal: His gait is altered he is favoring the right knee  He is tender over the medial joint line he has a 10 degree block to extension he flexes the knee fine twisting the knee and McMurray's reproduces pain  Imaging office images were taken  A/P  His [facility] urgent care appointment notes are reviewed again quadriceps tendon diagnosis, diclofenac 75 mg which the patient did take for a week  Encounter Diagnoses  Name Primary?   Acute pain of right knee Yes   Other tear of medial meniscus of right knee as current injury, initial encounter     The x-rays show mild varus of the knee no acute fracture or dislocation  He most likely has medial  meniscal tear will need surgery  Will do an MRI first and see him back to discuss the findings of the MRI and described the surgery in more detail

## 2022-12-18 ENCOUNTER — Ambulatory Visit (HOSPITAL_COMMUNITY)
Admission: RE | Admit: 2022-12-18 | Discharge: 2022-12-18 | Disposition: A | Discharge status: Home / Self Care | Payer: 59 | Source: Ambulatory Visit | Attending: Orthopedic Surgery | Admitting: Orthopedic Surgery

## 2022-12-18 DIAGNOSIS — S83241A Other tear of medial meniscus, current injury, right knee, initial encounter: Secondary | ICD-10-CM | POA: Insufficient documentation

## 2022-12-18 DIAGNOSIS — M25561 Pain in right knee: Secondary | ICD-10-CM | POA: Insufficient documentation

## 2023-01-04 ENCOUNTER — Encounter: Payer: Self-pay | Admitting: Orthopedic Surgery

## 2023-01-04 ENCOUNTER — Ambulatory Visit: Payer: 59 | Admitting: Orthopedic Surgery

## 2023-01-04 DIAGNOSIS — S83241A Other tear of medial meniscus, current injury, right knee, initial encounter: Secondary | ICD-10-CM

## 2023-01-04 DIAGNOSIS — M25561 Pain in right knee: Secondary | ICD-10-CM

## 2023-01-04 NOTE — Patient Instructions (Addendum)
Your surgery will be at [facility] by Dr Harrison  The hospital will contact you with a preoperative appointment to discuss Anesthesia.  Please arrive on time or 15 minutes early for the preoperative appointment, they have a very tight schedule if you are late or do not come in your surgery will be cancelled.  The phone number is [phone]. Please bring your medications with you for the appointment. They will tell you the arrival time and medication instructions when you have your preoperative evaluation. Do not wear nail polish the day of your surgery and if you take Phentermine you need to stop this medication ONE WEEK prior to your surgery. If you take Invokana, Farxiga, Jardiance, or Steglatro) - Hold 72 hours before the procedure.  If you take Ozempic,  Bydureon or Trulicity do not take for 8 days before your surgery. If you take Victoza, Rybelsis, Saxenda or Adlyxi stop 24 hours before the procedure.  Please arrive at the hospital 2 hours before procedure if scheduled at 9:30 or later in the day or at the time the nurse tells you at your preoperative visit.   If you have my chart do not use the time given in my chart use the time given to you by the nurse during your preoperative visit.   Your surgery  time may change. Please be available for phone calls the day of your surgery and the day before. The Short Stay department may need to discuss changes about your surgery time. Not reaching the you could lead to procedure delays and possible cancellation.  You must have a ride home and someone to stay with you for 24 to 48 hours. The person taking you home will receive and sign for the your discharge instructions.  Please be prepared to give your support person's name and telephone number to Central Registration. Dr Harrison will need that name and phone number post procedure.    Meniscus Injury, Arthroscopy   Arthroscopy is a surgical procedure that involves the use of a small scope that  has a camera and surgical instruments on the end (arthroscope). An arthroscope can be used to repair your meniscus injury.  LET YOUR HEALTH CARE PROVIDER KNOW ABOUT: Any allergies you have. All medicines you are taking, including vitamins, herbs, eyedrops, creams, and over-the-counter medicines. Any recent colds or infections you have had or currently have. Previous problems you or members of your family have had with the use of anesthetics. Any blood disorders or blood clotting problems you have. Previous surgeries you have had. Medical conditions you have. RISKS AND COMPLICATIONS Generally, this is a safe procedure. However, as with any procedure, problems can occur. Possible problems include: Damage to nerves or blood vessels. Excess bleeding. Blood clots. Infection. BEFORE THE PROCEDURE Do not eat or drink for 6-8 hours before the procedure. Take medicines as directed by your surgeon. Ask your surgeon about changing or stopping your regular medicines. You may have lab tests the morning of surgery. PROCEDURE  You will be given one of the following:  A medicine that numbs the area (local anesthesia). A medicine that makes you go to sleep (general anesthesia). A medicine injected into your spine that numbs your body below the waist (spinal anesthesia). Most often, several small cuts (incisions) are made in the knee. The arthroscope and instruments go into the incisions to repair the damage. The torn portion of the meniscus is removed.   AFTER THE PROCEDURE You will be taken to the recovery   area where your progress will be monitored. When you are awake, stable, and taking fluids without complications, you will be allowed to go home. This is usually the same day. A torn or stretched ligament (ligament sprain) may take 6-8 weeks to heal.  It takes about the 4-6 WEEKS if your surgeon removed a torn meniscus. A repaired meniscus may require 6-12 weeks of recovery time. A torn ligament  needing reconstructive surgery may take 6-12 months to heal fully.   This information is not intended to replace advice given to you by your health care provider. Make sure you discuss any questions you have with your health care provider. You have decided to proceed with operative arthroscopy of the knee. You have decided not to continue with nonoperative measures such as but not limited to oral medication, weight loss, activity modification, physical therapy, bracing, or injection.  We will perform operative arthroscopy of the knee. Some of the risks associated with arthroscopic surgery of the knee include but are not limited to Bleeding Infection Swelling Stiffness Blood clot Pain Need for knee replacement surgery    In compliance with recent [place] law in federal regulation regarding opioid use and abuse and addiction, we will taper (stop) opioid medication after 2 weeks.  If you're not comfortable with these risks and would like to continue with nonoperative treatment please let Dr. Harrison know prior to your surgery.  

## 2023-01-04 NOTE — Progress Notes (Signed)
MRI RESULTS FOLLOW UP   Encounter Diagnoses  Name Primary?   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  He says he was doing some work around the house woke up the next day had medial knee pain.  [facility] they diagnosed him with quadriceps tendinitis put him on diclofenac which he took for a week.  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 Oblique undersurface tear extending through the inferior articular surface of the middle and central thirds of the meniscal triangle of the posterior aspect of the body of the medial meniscus. There is a flipped fragment of meniscal tissue from the peripheral inferior aspect of the meniscal triangle into the medial gutter, measuring up to 17 mm in AP dimension. 2. Additional degenerative fraying of the central third of the meniscal triangle of the posterior horn of the medial meniscus near the root. Partial-thickness cartilage loss of the trochlear notch. 3. Thinning of the far medial aspect of the weight-bearing medial femoral condyle and medial tibial plateau  cartilage. Mild-to-moderate subchondral marrow edema within the far medial aspect of the medial tibial plateau.     Electronically Signed   By: Yvonne Kendall M.D.   On: 12/18/2022 17:50  ASSESSMENT AND PLAN :   52 year old male with symptomatic medial meniscus tear of the right knee;  Patient is still symptomatic he is agreed to proceeding with a right knee arthroscopy medial meniscectomy  The procedure has been fully reviewed with the patient; The risks and benefits of surgery have been discussed and explained and understood. Alternative treatment has also been reviewed, questions were encouraged and answered. The postoperative plan is also been reviewed.

## 2023-01-05 NOTE — Addendum Note (Signed)
Addended byCandice Camp on: 01/05/2023 10:18 AM   Modules accepted: Orders

## 2023-01-07 NOTE — Patient Instructions (Addendum)
Noah Cherry  01/07/2023     @PREFPERIOPPHARMACY @   Your procedure is scheduled on  01/15/2023.   Report to [facility] at  0600  A.M.   Call this number if you have problems the morning of surgery:  [phone]  If you experience any cold or flu symptoms such as cough, fever, chills, shortness of breath, etc. between now and your scheduled surgery, please notify us at the above number.   Remember:  Do not eat or drink after midnight.      Take these medicines the morning of surgery with A SIP OF WATER                                   pantoprazole.     Do not wear jewelry, make-up or nail polish.  Do not wear lotions, powders, or perfumes, or deodorant.  Do not shave 48 hours prior to surgery.  Men may shave face and neck.  Do not bring valuables to the hospital.  [facility] is not responsible for any belongings or valuables.  Contacts, dentures or bridgework may not be worn into surgery.  Leave your suitcase in the car.  After surgery it may be brought to your room.  For patients admitted to the hospital, discharge time will be determined by your treatment team.  Patients discharged the day of surgery will not be allowed to drive home and must have someone with them for 24 hours.    Special instructions:   DO NOT smoke tobacco or vape for 24 hours before your procedure.  Please read over the following fact sheets that you were given. Coughing and Deep Breathing, Surgical Site Infection Prevention, Anesthesia Post-op Instructions, and Care and Recovery After Surgery                                        Arthroscopic Knee Ligament Repair, Care After This sheet gives you information about how to care for yourself after your procedure. Your health care provider may also give you more specific instructions. If you have problems or questions, contact your health care provider. What can I expect after the procedure? After the procedure, it is common to  have: Soreness or pain in your knee. Bruising and swelling on your knee, calf, and ankle for 3-4 days. A small amount of fluid coming from the incisions. Follow these instructions at home: Medicines Take over-the-counter and prescription medicines only as told by your health care provider. Ask your health care provider if the medicine prescribed to you: Requires you to avoid driving or using machinery. Can cause constipation. You may need to take these actions to prevent or treat constipation: Drink enough fluid to keep your urine pale yellow. Take over-the-counter or prescription medicines. Eat foods that are high in fiber, such as beans, whole grains, and fresh fruits and vegetables. Limit foods that are high in fat and processed sugars, such as fried or sweet foods. If you have a brace or immobilizer: Wear it as told by your health care provider. Remove it only as told by your health care provider. Loosen it if your toes tingle, become numb, or turn cold and blue. Keep it clean and dry. Ask your health care provider when it is safe to drive. Bathing Do  not take baths, swim, or use a hot tub until your health care provider approves. Keep your bandage (dressing) dry until your health care provider says that it can be removed. If the brace or immobilizer is not waterproof: Do not let it get wet. Cover it with a watertight covering when you take a bath or shower. Incision care  Follow instructions from your health care provider about how to take care of your incisions. Make sure you: Wash your hands with soap and water for at least 20 seconds before and after you change your dressing. If soap and water are not available, use hand sanitizer. Change your dressing as told by your health care provider. Leave stitches (sutures), skin glue, or adhesive strips in place. These skin closures may need to stay in place for 2 weeks or longer. If adhesive strip edges start to loosen and curl up, you  may trim the loose edges. Do not remove adhesive strips completely unless your health care provider tells you to do that. Check your incision areas every day for signs of infection. Check for: Redness. More swelling or pain. Blood or more fluid. Warmth. Pus or a bad smell. Managing pain, stiffness, and swelling  If directed, put ice on the affected area. To do this: If you have a removable brace or immobilizer, remove it as told by your health care provider. Put ice in a plastic bag. Place a towel between your skin and the bag. Leave the ice on for 20 minutes, 2-3 times a day. Remove the ice if your skin turns bright red. This is very important. If you cannot feel pain, heat, or cold, you have a greater risk of damage to the area. Move your toes often to reduce stiffness and swelling. Raise (elevate) the injured area above the level of your heart while you are sitting or lying down. Activity Do not use your knee to support your body weight until your health care provider says that you can. Use crutches or other devices as told by your health care provider. Do physical therapy exercises as told by your health care provider. Physical therapy will help you regain movement and strength in your knee. Follow instructions from your health care provider about: When you may start motion exercises. When you may start riding a stationary bike and doing other low-impact activities. When you may start to jog and do other high-impact activities. Do not lift anything that is heavier than 10 lb (4.5 kg), or the limit that you are told, until your health care provider says that it is safe. Ask your health care provider what activities are safe for you. General instructions Do not use any products that contain nicotine or tobacco, such as cigarettes, e-cigarettes, and chewing tobacco. These can delay healing. If you need help quitting, ask your health care provider. Wear compression stockings as told by  your health care provider. These stockings help to prevent blood clots and reduce swelling in your legs. Keep all follow-up visits. This is important. Contact a health care provider if: You have any of these signs of infection: Redness around an incision. Blood or more fluid coming from an incision. Warmth coming from an incision. Pus or a bad smell coming from an incision. More swelling or pain in your knee. A fever or chills. You have pain that does not get better with medicine. Your incision opens up. Get help right away if: You have trouble breathing. You have chest pain. You have increased pain or  swelling in your calf or at the back of your knee. You have numbness and tingling near the knee joint or in the foot, ankle, or toes. You notice that your foot or toes look darker than normal or are cooler than normal. These symptoms may represent a serious problem that is an emergency. Do not wait to see if the symptoms will go away. Get medical help right away. Call your local emergency services (911 in the [place] Do not drive yourself to the hospital. Summary After the procedure, it is common to have knee pain with bruising and swelling on your knee, calf, and ankle. Icing your knee and raising your leg above the level of your heart will help control the pain and swelling. Do physical therapy exercises as told by your health care provider. Physical therapy will help you regain movement and strength in your knee. This information is not intended to replace advice given to you by your health care provider. Make sure you discuss any questions you have with your health care provider. Document Revised: 04/29/2020 Document Reviewed: 04/29/2020 Elsevier Patient Education  [place] Anesthesia, Adult, Care After The following information offers guidance on how to care for yourself after your procedure. Your health care provider may also give you more specific instructions. If  you have problems or questions, contact your health care provider. What can I expect after the procedure? After the procedure, it is common for people to: Have pain or discomfort at the IV site. Have nausea or vomiting. Have a sore throat or hoarseness. Have trouble concentrating. Feel cold or chills. Feel weak, sleepy, or tired (fatigue). Have soreness and body aches. These can affect parts of the body that were not involved in surgery. Follow these instructions at home: For the time period you were told by your health care provider:  Rest. Do not participate in activities where you could fall or become injured. Do not drive or use machinery. Do not drink alcohol. Do not take sleeping pills or medicines that cause drowsiness. Do not make important decisions or sign legal documents. Do not take care of children on your own. General instructions Drink enough fluid to keep your urine pale yellow. If you have sleep apnea, surgery and certain medicines can increase your risk for breathing problems. Follow instructions from your health care provider about wearing your sleep device: Anytime you are sleeping, including during daytime naps. While taking prescription pain medicines, sleeping medicines, or medicines that make you drowsy. Return to your normal activities as told by your health care provider. Ask your health care provider what activities are safe for you. Take over-the-counter and prescription medicines only as told by your health care provider. Do not use any products that contain nicotine or tobacco. These products include cigarettes, chewing tobacco, and vaping devices, such as e-cigarettes. These can delay incision healing after surgery. If you need help quitting, ask your health care provider. Contact a health care provider if: You have nausea or vomiting that does not get better with medicine. You vomit every time you eat or drink. You have pain that does not get better with  medicine. You cannot urinate or have bloody urine. You develop a skin rash. You have a fever. Get help right away if: You have trouble breathing. You have chest pain. You vomit blood. These symptoms may be an emergency. Get help right away. Call 911. Do not wait to see if the symptoms will go away. Do not drive yourself to  the hospital. Summary After the procedure, it is common to have a sore throat, hoarseness, nausea, vomiting, or to feel weak, sleepy, or fatigue. For the time period you were told by your health care provider, do not drive or use machinery. Get help right away if you have difficulty breathing, have chest pain, or vomit blood. These symptoms may be an emergency. This information is not intended to replace advice given to you by your health care provider. Make sure you discuss any questions you have with your health care provider. Document Revised: 02/27/2022 Document Reviewed: 02/27/2022 Elsevier Patient Education  [place]. How to Use Chlorhexidine Before Surgery Chlorhexidine gluconate (CHG) is a germ-killing (antiseptic) solution that is used to clean the skin. It can get rid of the bacteria that normally live on the skin and can keep them away for about 24 hours. To clean your skin with CHG, you may be given: A CHG solution to use in the shower or as part of a sponge bath. A prepackaged cloth that contains CHG. Cleaning your skin with CHG may help lower the risk for infection: While you are staying in the intensive care unit of the hospital. If you have a vascular access, such as a central line, to provide short-term or long-term access to your veins. If you have a catheter to drain urine from your bladder. If you are on a ventilator. A ventilator is a machine that helps you breathe by moving air in and out of your lungs. After surgery. What are the risks? Risks of using CHG include: A skin reaction. Hearing loss, if CHG gets in your ears and you have a  perforated eardrum. Eye injury, if CHG gets in your eyes and is not rinsed out. The CHG product catching fire. Make sure that you avoid smoking and flames after applying CHG to your skin. Do not use CHG: If you have a chlorhexidine allergy or have previously reacted to chlorhexidine. On babies younger than 4 months of age. How to use CHG solution Use CHG only as told by your health care provider, and follow the instructions on the label. Use the full amount of CHG as directed. Usually, this is one bottle. During a shower Follow these steps when using CHG solution during a shower (unless your health care provider gives you different instructions): Start the shower. Use your normal soap and shampoo to wash your face and hair. Turn off the shower or move out of the shower stream. Pour the CHG onto a clean washcloth. Do not use any type of brush or rough-edged sponge. Starting at your neck, lather your body down to your toes. Make sure you follow these instructions: If you will be having surgery, pay special attention to the part of your body where you will be having surgery. Scrub this area for at least 1 minute. Do not use CHG on your head or face. If the solution gets into your ears or eyes, rinse them well with water. Avoid your genital area. Avoid any areas of skin that have broken skin, cuts, or scrapes. Scrub your back and under your arms. Make sure to wash skin folds. Let the lather sit on your skin for 1-2 minutes or as long as told by your health care provider. Thoroughly rinse your entire body in the shower. Make sure that all body creases and crevices are rinsed well. Dry off with a clean towel. Do not put any substances on your body afterward--such as powder, lotion, or perfume--unless  you are told to do so by your health care provider. Only use lotions that are recommended by the manufacturer. Put on clean clothes or pajamas. If it is the night before your surgery, sleep in clean  sheets.  During a sponge bath Follow these steps when using CHG solution during a sponge bath (unless your health care provider gives you different instructions): Use your normal soap and shampoo to wash your face and hair. Pour the CHG onto a clean washcloth. Starting at your neck, lather your body down to your toes. Make sure you follow these instructions: If you will be having surgery, pay special attention to the part of your body where you will be having surgery. Scrub this area for at least 1 minute. Do not use CHG on your head or face. If the solution gets into your ears or eyes, rinse them well with water. Avoid your genital area. Avoid any areas of skin that have broken skin, cuts, or scrapes. Scrub your back and under your arms. Make sure to wash skin folds. Let the lather sit on your skin for 1-2 minutes or as long as told by your health care provider. Using a different clean, wet washcloth, thoroughly rinse your entire body. Make sure that all body creases and crevices are rinsed well. Dry off with a clean towel. Do not put any substances on your body afterward--such as powder, lotion, or perfume--unless you are told to do so by your health care provider. Only use lotions that are recommended by the manufacturer. Put on clean clothes or pajamas. If it is the night before your surgery, sleep in clean sheets. How to use CHG prepackaged cloths Only use CHG cloths as told by your health care provider, and follow the instructions on the label. Use the CHG cloth on clean, dry skin. Do not use the CHG cloth on your head or face unless your health care provider tells you to. When washing with the CHG cloth: Avoid your genital area. Avoid any areas of skin that have broken skin, cuts, or scrapes. Before surgery Follow these steps when using a CHG cloth to clean before surgery (unless your health care provider gives you different instructions): Using the CHG cloth, vigorously scrub the  part of your body where you will be having surgery. Scrub using a back-and-forth motion for 3 minutes. The area on your body should be completely wet with CHG when you are done scrubbing. Do not rinse. Discard the cloth and let the area air-dry. Do not put any substances on the area afterward, such as powder, lotion, or perfume. Put on clean clothes or pajamas. If it is the night before your surgery, sleep in clean sheets.  For general bathing Follow these steps when using CHG cloths for general bathing (unless your health care provider gives you different instructions). Use a separate CHG cloth for each area of your body. Make sure you wash between any folds of skin and between your fingers and toes. Wash your body in the following order, switching to a new cloth after each step: The front of your neck, shoulders, and chest. Both of your arms, under your arms, and your hands. Your stomach and groin area, avoiding the genitals. Your right leg and foot. Your left leg and foot. The back of your neck, your back, and your buttocks. Do not rinse. Discard the cloth and let the area air-dry. Do not put any substances on your body afterward--such as powder, lotion, or perfume--unless you are  told to do so by your health care provider. Only use lotions that are recommended by the manufacturer. Put on clean clothes or pajamas. Contact a health care provider if: Your skin gets irritated after scrubbing. You have questions about using your solution or cloth. You swallow any chlorhexidine. Call your local poison control center (570-817-2238 in the [place] Get help right away if: Your eyes itch badly, or they become very red or swollen. Your skin itches badly and is red or swollen. Your hearing changes. You have trouble seeing. You have swelling or tingling in your mouth or throat. You have trouble breathing. These symptoms may represent a serious problem that is an emergency. Do not wait to see if the  symptoms will go away. Get medical help right away. Call your local emergency services (911 in the [place] Do not drive yourself to the hospital. Summary Chlorhexidine gluconate (CHG) is a germ-killing (antiseptic) solution that is used to clean the skin. Cleaning your skin with CHG may help to lower your risk for infection. You may be given CHG to use for bathing. It may be in a bottle or in a prepackaged cloth to use on your skin. Carefully follow your health care provider's instructions and the instructions on the product label. Do not use CHG if you have a chlorhexidine allergy. Contact your health care provider if your skin gets irritated after scrubbing. This information is not intended to replace advice given to you by your health care provider. Make sure you discuss any questions you have with your health care provider. Document Revised: 03/30/2022 Document Reviewed: 02/10/2021 Elsevier Patient Education  2023 [employer].

## 2023-01-11 ENCOUNTER — Encounter (HOSPITAL_COMMUNITY)
Admission: RE | Admit: 2023-01-11 | Discharge: 2023-01-11 | Disposition: A | Discharge status: Home / Self Care | Payer: 59 | Source: Ambulatory Visit | Attending: Orthopedic Surgery | Admitting: Orthopedic Surgery

## 2023-01-11 ENCOUNTER — Encounter (HOSPITAL_COMMUNITY): Payer: Self-pay

## 2023-01-11 VITALS — BP 134/87 | HR 80 | Temp 97.8°F | Resp 18 | Ht 73.0 in | Wt 209.4 lb

## 2023-01-11 DIAGNOSIS — Z01818 Encounter for other preprocedural examination: Secondary | ICD-10-CM | POA: Insufficient documentation

## 2023-01-11 DIAGNOSIS — S83241A Other tear of medial meniscus, current injury, right knee, initial encounter: Secondary | ICD-10-CM | POA: Insufficient documentation

## 2023-01-11 DIAGNOSIS — F17200 Nicotine dependence, unspecified, uncomplicated: Secondary | ICD-10-CM

## 2023-01-11 HISTORY — DX: Unspecified osteoarthritis, unspecified site: M19.90

## 2023-01-11 HISTORY — DX: Pneumonia, unspecified organism: J18.9

## 2023-01-11 LAB — CBC WITH DIFFERENTIAL/PLATELET
Abs Immature Granulocytes: 0.04 10*3/uL (ref 0.00–0.07)
Basophils Absolute: 0.1 10*3/uL (ref 0.0–0.1)
Basophils Relative: 1 %
Eosinophils Absolute: 0.4 10*3/uL (ref 0.0–0.5)
Eosinophils Relative: 5 %
HCT: 45.9 % (ref 39.0–52.0)
Hemoglobin: 15.2 g/dL (ref 13.0–17.0)
Immature Granulocytes: 1 %
Lymphocytes Relative: 41 %
Lymphs Abs: 3.4 10*3/uL (ref 0.7–4.0)
MCH: 33 pg (ref 26.0–34.0)
MCHC: 33.1 g/dL (ref 30.0–36.0)
MCV: 99.6 fL (ref 80.0–100.0)
Monocytes Absolute: 0.7 10*3/uL (ref 0.1–1.0)
Monocytes Relative: 8 %
Neutro Abs: 3.7 10*3/uL (ref 1.7–7.7)
Neutrophils Relative %: 44 %
Platelets: 295 10*3/uL (ref 150–400)
RBC: 4.61 MIL/uL (ref 4.22–5.81)
RDW: 13.6 % (ref 11.5–15.5)
WBC: 8.4 10*3/uL (ref 4.0–10.5)
nRBC: 0 % (ref 0.0–0.2)

## 2023-01-11 LAB — BASIC METABOLIC PANEL
Anion gap: 10 (ref 5–15)
BUN: 16 mg/dL (ref 6–20)
CO2: 23 mmol/L (ref 22–32)
Calcium: 8.9 mg/dL (ref 8.9–10.3)
Chloride: 105 mmol/L (ref 98–111)
Creatinine, Ser: 1.01 mg/dL (ref 0.61–1.24)
GFR, Estimated: >60 mL/min (ref >60)
Glucose, Bld: 119 mg/dL — ABNORMAL HIGH (ref 70–99)
Potassium: 3.7 mmol/L (ref 3.5–5.1)
Sodium: 138 mmol/L (ref 135–145)

## 2023-01-14 ENCOUNTER — Encounter (HOSPITAL_COMMUNITY): Payer: Self-pay | Admitting: Orthopedic Surgery

## 2023-01-14 NOTE — H&P (Signed)
Noah Cherry is an 52 y.o. male.     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  He says he was doing some work around the house woke up the next day had medial knee pain.  [facility] they diagnosed him with quadriceps tendinitis put him on diclofenac which he took for a week.  This did not help   His pain is actually along the medial joint line he cannot really extend the knee it is hard for him to drive the truck and work the gas pedal   He has lack of confidence in the knee in terms of weightbearing    Past Medical History:  Diagnosis Date   Arthritis    GERD (gastroesophageal reflux disease)    High cholesterol    Pneumonia     Past Surgical History:  Procedure Laterality Date   APPENDECTOMY     COLONOSCOPY N/A 06/01/2014   Procedure: COLONOSCOPY;  Surgeon: Rogene Houston, MD;  Location: [facility];  Service: Endoscopy;  Laterality: N/A;  100-moved to 200 Ann to notify pt   ESOPHAGOGASTRODUODENOSCOPY N/A 06/01/2014   Procedure: ESOPHAGOGASTRODUODENOSCOPY (EGD);  Surgeon: Rogene Houston, MD;  Location: [facility];  Service: Endoscopy;  Laterality: N/A;   left arm surgery     laceration   Right ankle surgery Right 2021    History reviewed. No pertinent family history. Social History:  reports that he has been smoking cigarettes. He has been smoking an average of 1 pack per day. His smokeless tobacco use includes chew. He reports current alcohol use of about 24.0 standard drinks of alcohol per week. He reports that he does not use drugs.  Allergies: No Known Allergies  No medications prior to admission.    No results found for this or any previous visit (from the past 48 hour(s)). No results found.  Review of Systems  Constitutional: Negative.   Respiratory: Negative.    Cardiovascular: Negative.     There were no vitals taken for this  visit. Physical Exam Constitutional:      General: He is not in acute distress.    Appearance: Normal appearance. He is not ill-appearing, toxic-appearing or diaphoretic.  HENT:     Head: Normocephalic.     Nose: Nose normal.     Mouth/Throat:     Mouth: Mucous membranes are moist.  Eyes:     General: No scleral icterus.    Extraocular Movements: Extraocular movements intact.     Pupils: Pupils are equal, round, and reactive to light.  Cardiovascular:     Rate and Rhythm: Normal rate.     Pulses: Normal pulses.  Pulmonary:     Effort: Pulmonary effort is normal.     Breath sounds: Normal breath sounds.  Abdominal:     General: Abdomen is flat.  Musculoskeletal:     Cervical back: Normal range of motion.  Skin:    General: Skin is warm and dry.     Capillary Refill: Capillary refill takes less than 2 seconds.  Neurological:     General: No focal deficit present.     Mental Status: He is alert and oriented to person, place, and time.  Psychiatric:        Mood and Affect: Mood normal.        Behavior: Behavior normal.        Thought  Content: Thought content normal.        Judgment: Judgment normal.     + EXAM FINDINGS: Musculoskeletal: His gait is altered he is favoring the right knee   He is tender over the medial joint line he has a 10 degree block to extension he flexes the knee fine twisting the knee and McMurray's reproduces pain  Assessment/Plan   MRI REPORT:    IMPRESSION: 1. Oblique undersurface tear extending through the inferior articular surface of the middle and central thirds of the meniscal triangle of the posterior aspect of the body of the medial meniscus. There is a flipped fragment of meniscal tissue from the peripheral inferior aspect of the meniscal triangle into the medial gutter, measuring up to 17 mm in AP dimension. 2. Additional degenerative fraying of the central third of the meniscal triangle of the posterior horn of the medial meniscus  near the root. Partial-thickness cartilage loss of the trochlear notch. 3. Thinning of the far medial aspect of the weight-bearing medial femoral condyle and medial tibial plateau cartilage. Mild-to-moderate subchondral marrow edema within the far medial aspect of the medial tibial plateau.    Torn medial meniscus left knee  Arthroscopy left knee partial medial meniscectomy  Arther Abbott, MD 01/14/2023, 1:57 PM

## 2023-01-15 ENCOUNTER — Encounter (HOSPITAL_COMMUNITY): Payer: Self-pay | Admitting: Orthopedic Surgery

## 2023-01-15 ENCOUNTER — Other Ambulatory Visit: Payer: Self-pay

## 2023-01-15 ENCOUNTER — Telehealth: Payer: Self-pay

## 2023-01-15 ENCOUNTER — Ambulatory Visit (HOSPITAL_COMMUNITY)
Admission: RE | Admit: 2023-01-15 | Discharge: 2023-01-15 | Disposition: A | Discharge status: Home / Self Care | Payer: 59 | Source: Ambulatory Visit | Attending: Orthopedic Surgery | Admitting: Orthopedic Surgery

## 2023-01-15 ENCOUNTER — Ambulatory Visit (HOSPITAL_COMMUNITY): Payer: 59 | Admitting: Anesthesiology

## 2023-01-15 ENCOUNTER — Encounter (HOSPITAL_COMMUNITY)
Admission: RE | Disposition: A | Discharge status: Home / Self Care | Payer: Self-pay | Source: Ambulatory Visit | Attending: Orthopedic Surgery

## 2023-01-15 ENCOUNTER — Ambulatory Visit (HOSPITAL_BASED_OUTPATIENT_CLINIC_OR_DEPARTMENT_OTHER): Payer: 59 | Admitting: Anesthesiology

## 2023-01-15 DIAGNOSIS — G4730 Sleep apnea, unspecified: Secondary | ICD-10-CM | POA: Insufficient documentation

## 2023-01-15 DIAGNOSIS — S83221D Peripheral tear of medial meniscus, current injury, right knee, subsequent encounter: Secondary | ICD-10-CM | POA: Diagnosis not present

## 2023-01-15 DIAGNOSIS — K219 Gastro-esophageal reflux disease without esophagitis: Secondary | ICD-10-CM | POA: Diagnosis not present

## 2023-01-15 DIAGNOSIS — X58XXXA Exposure to other specified factors, initial encounter: Secondary | ICD-10-CM | POA: Diagnosis not present

## 2023-01-15 DIAGNOSIS — F17210 Nicotine dependence, cigarettes, uncomplicated: Secondary | ICD-10-CM | POA: Diagnosis not present

## 2023-01-15 DIAGNOSIS — S83241A Other tear of medial meniscus, current injury, right knee, initial encounter: Secondary | ICD-10-CM

## 2023-01-15 HISTORY — PX: KNEE ARTHROSCOPY WITH MEDIAL MENISECTOMY: SHX5651

## 2023-01-15 SURGERY — ARTHROSCOPY, KNEE, WITH MEDIAL MENISCECTOMY
Anesthesia: General | Site: Knee | Laterality: Right

## 2023-01-15 MED ORDER — LACTATED RINGERS IV SOLN: INTRAVENOUS | Status: DC

## 2023-01-15 MED ORDER — HYDROCODONE-ACETAMINOPHEN 5-325 MG PO TABS: 1.0000 | ORAL_TABLET | ORAL | 0 refills | Status: AC | PRN

## 2023-01-15 MED ORDER — PROPOFOL 10 MG/ML IV BOLUS
INTRAVENOUS | Status: AC
  Filled 2023-01-15: qty 20

## 2023-01-15 MED ORDER — MEPERIDINE HCL 50 MG/ML IJ SOLN: 6.2500 mg | INTRAMUSCULAR | Status: DC | PRN

## 2023-01-15 MED ORDER — FENTANYL CITRATE (PF) 100 MCG/2ML IJ SOLN
INTRAMUSCULAR | Status: DC | PRN
  Administered 2023-01-15 (×2): 50 ug via INTRAVENOUS

## 2023-01-15 MED ORDER — HYDROCODONE-ACETAMINOPHEN 5-325 MG PO TABS
2.0000 | ORAL_TABLET | Freq: Once | ORAL | Status: AC
  Administered 2023-01-15: 2 via ORAL

## 2023-01-15 MED ORDER — DEXAMETHASONE SODIUM PHOSPHATE 10 MG/ML IJ SOLN
INTRAMUSCULAR | Status: AC
  Filled 2023-01-15: qty 1

## 2023-01-15 MED ORDER — KETOROLAC TROMETHAMINE 30 MG/ML IJ SOLN
INTRAMUSCULAR | Status: DC | PRN
  Administered 2023-01-15: 30 mg via INTRAVENOUS

## 2023-01-15 MED ORDER — CHLORHEXIDINE GLUCONATE 0.12 % MT SOLN
15.0000 mL | Freq: Once | OROMUCOSAL | Status: AC
  Administered 2023-01-15: 15 mL via OROMUCOSAL
  Filled 2023-01-15: qty 15

## 2023-01-15 MED ORDER — ONDANSETRON HCL 4 MG/2ML IJ SOLN
INTRAMUSCULAR | Status: DC | PRN
  Administered 2023-01-15: 4 mg via INTRAVENOUS

## 2023-01-15 MED ORDER — HYDROMORPHONE HCL 1 MG/ML IJ SOLN
0.2500 mg | INTRAMUSCULAR | Status: DC | PRN
  Administered 2023-01-15: 0.5 mg via INTRAVENOUS
  Filled 2023-01-15: qty 0.5

## 2023-01-15 MED ORDER — DEXAMETHASONE SODIUM PHOSPHATE 10 MG/ML IJ SOLN
INTRAMUSCULAR | Status: DC | PRN
  Administered 2023-01-15: 10 mg via INTRAVENOUS

## 2023-01-15 MED ORDER — MIDAZOLAM HCL 2 MG/2ML IJ SOLN
INTRAMUSCULAR | Status: AC
  Filled 2023-01-15: qty 2

## 2023-01-15 MED ORDER — SODIUM CHLORIDE 0.9 % IR SOLN
Status: DC | PRN
  Administered 2023-01-15: 1000 mL

## 2023-01-15 MED ORDER — BUPIVACAINE-EPINEPHRINE (PF) 0.5% -1:200000 IJ SOLN
INTRAMUSCULAR | Status: DC | PRN
  Administered 2023-01-15: 60 mL via PERINEURAL

## 2023-01-15 MED ORDER — ONDANSETRON HCL 4 MG/2ML IJ SOLN: 4.0000 mg | Freq: Once | INTRAMUSCULAR | Status: DC | PRN

## 2023-01-15 MED ORDER — ACETAMINOPHEN 500 MG PO TABS
500.0000 mg | ORAL_TABLET | Freq: Once | ORAL | Status: AC
  Administered 2023-01-15: 500 mg via ORAL
  Filled 2023-01-15: qty 1

## 2023-01-15 MED ORDER — HYDROCODONE-ACETAMINOPHEN 5-325 MG PO TABS
ORAL_TABLET | ORAL | Status: AC
  Filled 2023-01-15: qty 2

## 2023-01-15 MED ORDER — PROPOFOL 10 MG/ML IV BOLUS
INTRAVENOUS | Status: DC | PRN
  Administered 2023-01-15: 200 mg via INTRAVENOUS

## 2023-01-15 MED ORDER — FENTANYL CITRATE (PF) 100 MCG/2ML IJ SOLN
INTRAMUSCULAR | Status: AC
  Filled 2023-01-15: qty 2

## 2023-01-15 MED ORDER — ORAL CARE MOUTH RINSE: 15.0000 mL | Freq: Once | OROMUCOSAL | Status: AC

## 2023-01-15 MED ORDER — BUPIVACAINE-EPINEPHRINE (PF) 0.5% -1:200000 IJ SOLN
INTRAMUSCULAR | Status: AC
  Filled 2023-01-15: qty 60

## 2023-01-15 MED ORDER — LIDOCAINE 2% (20 MG/ML) 5 ML SYRINGE
INTRAMUSCULAR | Status: DC | PRN
  Administered 2023-01-15: 80 mg via INTRAVENOUS

## 2023-01-15 MED ORDER — SODIUM CHLORIDE 0.9 % IR SOLN
Status: DC | PRN
  Administered 2023-01-15 (×4): 3000 mL

## 2023-01-15 MED ORDER — MIDAZOLAM HCL 2 MG/2ML IJ SOLN
INTRAMUSCULAR | Status: DC | PRN
  Administered 2023-01-15: 2 mg via INTRAVENOUS

## 2023-01-15 MED ORDER — CEFAZOLIN SODIUM-DEXTROSE 2-4 GM/100ML-% IV SOLN
2.0000 g | INTRAVENOUS | Status: AC
  Administered 2023-01-15: 2 g via INTRAVENOUS
  Filled 2023-01-15: qty 100

## 2023-01-15 SURGICAL SUPPLY — 58 items
ABLATOR ASPIRATE 50D MULTI-PRT (SURGICAL WAND) IMPLANT
APL PRP STRL LF DISP 70% ISPRP (MISCELLANEOUS) ×1
BAG HAMPER (MISCELLANEOUS) ×1 IMPLANT
BLADE SHAVER TORPEDO 4X13 (MISCELLANEOUS) IMPLANT
BLADE SURG SZ11 CARB STEEL (BLADE) ×1 IMPLANT
BNDG CMPR STD VLCR NS LF 5.8X6 (GAUZE/BANDAGES/DRESSINGS) ×1
BNDG ELASTIC 6X5.8 VLCR NS LF (GAUZE/BANDAGES/DRESSINGS) ×1 IMPLANT
CANN ZONE NAVIGATOR RT DISP (ORTHOPEDIC DISPOSABLE SUPPLIES) ×1
CANNULA ZONE NAVIGATOR RT DISP (ORTHOPEDIC DISPOSABLE SUPPLIES) IMPLANT
CHLORAPREP W/TINT 26 (MISCELLANEOUS) ×1 IMPLANT
CLOTH BEACON ORANGE TIMEOUT ST (SAFETY) ×1 IMPLANT
COOLER ICEMAN CLASSIC (MISCELLANEOUS) ×1 IMPLANT
COVER LIGHT HANDLE STERIS (MISCELLANEOUS) ×2 IMPLANT
DECANTER SPIKE VIAL GLASS SM (MISCELLANEOUS) ×2 IMPLANT
DRAPE HALF SHEET 40X57 (DRAPES) ×1 IMPLANT
FIBERSTICK 2 (SUTURE) IMPLANT
GAUZE 4X4 16PLY ~~LOC~~+RFID DBL (SPONGE) ×1 IMPLANT
GAUZE SPONGE 4X4 12PLY STRL (GAUZE/BANDAGES/DRESSINGS) ×1 IMPLANT
GAUZE XEROFORM 5X9 LF (GAUZE/BANDAGES/DRESSINGS) ×1 IMPLANT
GLOVE BIO SURGEON STRL SZ7 (GLOVE) IMPLANT
GLOVE BIOGEL M 7.0 STRL (GLOVE) IMPLANT
GLOVE BIOGEL PI IND STRL 7.0 (GLOVE) ×2 IMPLANT
GLOVE SS N UNI LF 8.5 STRL (GLOVE) ×1 IMPLANT
GLOVE SURG POLYISO LF SZ8 (GLOVE) ×1 IMPLANT
GOWN STRL REUS W/TWL LRG LVL3 (GOWN DISPOSABLE) ×1 IMPLANT
GOWN STRL REUS W/TWL XL LVL3 (GOWN DISPOSABLE) ×1 IMPLANT
HANDLE ZONE NAVIGATOR (ORTHOPEDIC DISPOSABLE SUPPLIES) IMPLANT
IV NS IRRIG 3000ML ARTHROMATIC (IV SOLUTION) ×2 IMPLANT
KIT BLADEGUARD II DBL (SET/KITS/TRAYS/PACK) ×1 IMPLANT
KIT TURNOVER CYSTO (KITS) ×1 IMPLANT
LASSO CRESCENT QUICKPASS (SUTURE) IMPLANT
MANIFOLD NEPTUNE II (INSTRUMENTS) ×1 IMPLANT
MARKER SKIN DUAL TIP RULER LAB (MISCELLANEOUS) ×1 IMPLANT
MID POST RT CANNULA AR7910R (ORTHOPEDIC DISPOSABLE SUPPLIES) ×1
NDL HYPO 18GX1.5 BLUNT FILL (NEEDLE) ×1 IMPLANT
NDL HYPO 21X1.5 SAFETY (NEEDLE) ×1 IMPLANT
NDL SPNL 18GX3.5 QUINCKE PK (NEEDLE) ×1 IMPLANT
NEEDLE HYPO 18GX1.5 BLUNT FILL (NEEDLE) ×1 IMPLANT
NEEDLE HYPO 21X1.5 SAFETY (NEEDLE) ×1 IMPLANT
NEEDLE SPNL 18GX3.5 QUINCKE PK (NEEDLE) ×1 IMPLANT
NS IRRIG 1000ML POUR BTL (IV SOLUTION) ×1 IMPLANT
PACK ARTHROSCOPY WL (CUSTOM PROCEDURE TRAY) ×1 IMPLANT
PAD ABD 5X9 TENDERSORB (GAUZE/BANDAGES/DRESSINGS) ×1 IMPLANT
PAD ARMBOARD 7.5X6 YLW CONV (MISCELLANEOUS) ×1 IMPLANT
PAD COLD SHLDR SM WRAP-ON (PAD) IMPLANT
PAD FOR LEG HOLDER (MISCELLANEOUS) ×1 IMPLANT
PADDING CAST COTTON 6X4 STRL (CAST SUPPLIES) ×1 IMPLANT
PORT APPOLLO RF 90DEGREE MULTI (SURGICAL WAND) IMPLANT
SET ARTHROSCOPY INST (INSTRUMENTS) ×1 IMPLANT
SET BASIN LINEN APH (SET/KITS/TRAYS/PACK) ×1 IMPLANT
SUT ETHILON 3 0 FSL (SUTURE) ×1 IMPLANT
SUTURE TAPE 2-0 MENISCS NDL (SUTURE) IMPLANT
SUTURETAPE 2-0 MENISCS NDL (SUTURE) ×1
SYR 10ML LL (SYRINGE) ×1 IMPLANT
SYR 30ML LL (SYRINGE) ×2 IMPLANT
TUBE CONNECTING 12X1/4 (SUCTIONS) ×2 IMPLANT
TUBING IN/OUT FLOW W/MAIN PUMP (TUBING) ×1 IMPLANT
ZONE NAVIGATOR-HANDLE AR7900 (ORTHOPEDIC DISPOSABLE SUPPLIES) ×1

## 2023-01-15 NOTE — Progress Notes (Signed)
Instructed on incentive spirometer. 1750 ml obtained. Tolerated well. 

## 2023-01-15 NOTE — Anesthesia Postprocedure Evaluation (Signed)
Anesthesia Post Note  Patient: Noah Cherry  Procedure(s) Performed: KNEE ARTHROSCOPY WITH MEDIAL MENISCAL REPAIR AND PARTIAL MEDIAL MENISECTOMY (Right: Knee)  Patient location during evaluation: Phase II Anesthesia Type: General Level of consciousness: awake and alert and oriented Pain management: pain level controlled Vital Signs Assessment: post-procedure vital signs reviewed and stable Respiratory status: spontaneous breathing, nonlabored ventilation and respiratory function stable Cardiovascular status: blood pressure returned to baseline and stable Postop Assessment: no apparent nausea or vomiting Anesthetic complications: no  No notable events documented.   Last Vitals:  Vitals:   01/15/23 1030 01/15/23 1049  BP: 128/82 (!) 136/91  Pulse: 73 75  Resp: 14   Temp:    SpO2: 95% 95%    Last Pain:  Vitals:   01/15/23 1049  PainSc: 6                  Symia Herdt C Tyri Elmore

## 2023-01-15 NOTE — Brief Op Note (Signed)
01/15/2023  9:33 AM  PATIENT:  Noah Cherry  52 y.o. male  PRE-OPERATIVE DIAGNOSIS:  right knee torn medial meniscus  POST-OPERATIVE DIAGNOSIS:  right knee torn medial meniscus  PROCEDURE:  Procedure(s): KNEE ARTHROSCOPY WITH MEDIAL MENISCAL REPAIR AND PARTIAL MEDIAL MENISECTOMY (Right)   Operative findings posterior horn medial meniscus tear white white zone  Read read tear body of the meniscus 1-1/2 cm  Remaining articular surfaces and ligaments normal SURGEON:  Surgeon(s) and Role:    Carole Civil, MD - Primary  PHYSICIAN ASSISTANT:   ASSISTANTS: none   ANESTHESIA:   general  EBL:  none   BLOOD ADMINISTERED:none  DRAINS: none   LOCAL MEDICATIONS USED:  MARCAINE     SPECIMEN:  No Specimen  DISPOSITION OF SPECIMEN:  N/A  COUNTS:  YES  TOURNIQUET:  * No tourniquets in log *  DICTATION: .Dragon Dictation  PLAN OF CARE: Discharge to home after PACU  PATIENT DISPOSITION:  PACU - hemodynamically stable.   Delay start of Pharmacological VTE agent (>24hrs) due to surgical blood loss or risk of bleeding: not applicable

## 2023-01-15 NOTE — Anesthesia Preprocedure Evaluation (Signed)
Anesthesia Evaluation  Patient identified by MRN, date of birth, ID band Patient awake    Reviewed: Allergy & Precautions, H&P , NPO status , Patient's Chart, lab work & pertinent test results  Airway Mallampati: II  TM Distance: >3 FB Neck ROM: Full    Dental  (+) Dental Advisory Given, Missing   Pulmonary sleep apnea and Continuous Positive Airway Pressure Ventilation , pneumonia, resolved, Current Smoker and Patient abstained from smoking.   Pulmonary exam normal breath sounds clear to auscultation       Cardiovascular negative cardio ROS Normal cardiovascular exam Rhythm:Regular Rate:Normal     Neuro/Psych negative neurological ROS  negative psych ROS   GI/Hepatic Neg liver ROS,GERD  Medicated and Controlled,,Rectal bleeeding   Endo/Other  negative endocrine ROS    Renal/GU negative Renal ROS  negative genitourinary   Musculoskeletal negative musculoskeletal ROS (+)    Abdominal   Peds negative pediatric ROS (+)  Hematology negative hematology ROS (+)   Anesthesia Other Findings   Reproductive/Obstetrics negative OB ROS                             Anesthesia Physical Anesthesia Plan  ASA: 2  Anesthesia Plan: General   Post-op Pain Management:    Induction: Intravenous  PONV Risk Score and Plan: 2 and Ondansetron, Dexamethasone and Midazolam  Airway Management Planned: LMA  Additional Equipment:   Intra-op Plan:   Post-operative Plan: Extubation in OR  Informed Consent: I have reviewed the patients History and Physical, chart, labs and discussed the procedure including the risks, benefits and alternatives for the proposed anesthesia with the patient or authorized representative who has indicated his/her understanding and acceptance.     Dental advisory given  Plan Discussed with: CRNA and Surgeon  Anesthesia Plan Comments:        Anesthesia Quick  Evaluation

## 2023-01-15 NOTE — Transfer of Care (Signed)
Immediate Anesthesia Transfer of Care Note  Patient: Noah Cherry  Procedure(s) Performed: KNEE ARTHROSCOPY WITH MEDIAL MENISCAL REPAIR AND PARTIAL MEDIAL MENISECTOMY (Right: Knee)  Patient Location: PACU  Anesthesia Type:General  Level of Consciousness: awake, alert , and oriented  Airway & Oxygen Therapy: Patient Spontanous Breathing and Patient connected to face mask oxygen  Post-op Assessment: Report given to RN and Post -op Vital signs reviewed and stable  Post vital signs: Reviewed and stable  Last Vitals:  Vitals Value Taken Time  BP 124/84 01/15/23 0939  Temp    Pulse 84 01/15/23 0941  Resp 17 01/15/23 0941  SpO2 99 % 01/15/23 0941  Vitals shown include unvalidated device data.  Last Pain:  Vitals:   01/15/23 0645  PainSc: 0-No pain         Complications: No notable events documented.

## 2023-01-15 NOTE — Anesthesia Procedure Notes (Signed)
Procedure Name: LMA Insertion Date/Time: 01/15/2023 7:33 AM  Performed by: [place] Manus Rudd, CRNAPre-anesthesia Checklist: Patient identified, Emergency Drugs available, Suction available and Patient being monitored Patient Re-evaluated:Patient Re-evaluated prior to induction Oxygen Delivery Method: Circle system utilized Preoxygenation: Pre-oxygenation with 100% oxygen Induction Type: IV induction Ventilation: Mask ventilation without difficulty LMA: LMA inserted LMA Size: 4.0 Number of attempts: 1 Airway Equipment and Method: Bite block Placement Confirmation: positive ETCO2 Tube secured with: Tape Dental Injury: Teeth and Oropharynx as per pre-operative assessment

## 2023-01-15 NOTE — Interval H&P Note (Signed)
History and Physical Interval Note:  01/15/2023 7:15 AM  Noah Cherry  has presented today for surgery, with the diagnosis of right knee torn medial meniscus.  The various methods of treatment have been discussed with the patient and family. After consideration of risks, benefits and other options for treatment, the patient has consented to  Procedure(s): KNEE ARTHROSCOPY WITH MEDIAL MENISECTOMY (Right) as a surgical intervention.  The patient's history has been reviewed, patient examined, no change in status, stable for surgery.  I have reviewed the patient's chart and labs.  Questions were answered to the patient's satisfaction.     Arther Abbott

## 2023-01-15 NOTE — Telephone Encounter (Signed)
I have tried twice to contact patient so we can get his post op appointment set up. I have left message for him to call the office so we can get it set up.

## 2023-01-15 NOTE — Op Note (Signed)
01/15/2023  9:35 AM  Knee arthroscopy dictation  Preop diagnosis torn medial meniscus right knee  Postop diagnosis same  Procedure arthroscopy medial meniscus repair of the body and partial medial meniscectomy posterior horn  Surgeon Aline Brochure  Operative findings  MEDIAL - meniscus posterior horn medial meniscus tear and a tear at the body red red zone -articular surface normal  LATERAL - meniscus normal -articular surface normal  PTF   -articular surface normal  NOTCH  -acl normal -pcl normal     The patient was identified in the preoperative holding area using 2 approved identification mechanisms. The chart was reviewed and updated. The surgical site was confirmed as right knee and marked with an indelible marker.  The patient was taken to the operating room for anesthesia. After successful general anesthesia, Ancef 2 g was used as IV antibiotics.  The patient was placed in the supine position with the (right) the operative extremity in an arthroscopic leg holder and the opposite extremity in a padded leg holder.  The timeout was executed.  A lateral portal was established with an 11 blade and the scope was introduced into the joint. A diagnostic arthroscopy was performed in circumferential manner examining the entire knee joint. A medial portal was established and the diagnostic arthroscopy was repeated using a probe to palpate intra-articular structures as they were encountered.    The medial meniscus was resected using a duckbill forceps. The meniscal fragments were removed with a motorized shaver.  There was also a tear of the red red zone at the body of the meniscus and resection would have required a complete anterior horn resection so I decided to repair the meniscus with an outside in technique  With use of a spinal needle I passed the needle through the tear 3 times passing a fiber stick and then using a suture lasso to pass the suture in a vertical mattress  fashion  I then made a transverse incision to tie the sutures over the capsule.  We used 2 sutures as one of the sutures was removed as it was not necessary to complete the repair  The scope was then placed in wash mode and any excess debris was removed from the joint using suction.  60 cc of Marcaine with epinephrine was injected through the arthroscope.  The portals were closed with 3-0 nylon suture.  The wound on the side was also closed with 3-0 nylon suture  A sterile bandage, Ace wrap and Cryo/Cuff was placed and the Cryo/Cuff was activated. The patient was taken to the recovery room in stable condition.  PHYSICIAN ASSISTANT: no  ASSISTANTS: none   ANESTHESIA:   General  EBL:  none   BLOOD ADMINISTERED:none  DRAINS: none   LOCAL MEDICATIONS USED:  MARCAINE     SPECIMEN:  No Specimen  DISPOSITION OF SPECIMEN:  N/A  COUNTS:  YES   DICTATION: .Dragon Dictation  PLAN OF CARE: Discharge to home after PACU  PATIENT DISPOSITION:  PACU - hemodynamically stable.   Delay start of Pharmacological VTE agent (>24hrs) due to surgical blood loss or risk of bleeding: not applicable  POST OP PLAN  WB as tolerated SUTURES OUT IN 10 days AROM  BRACE to be applied in the office 0-90

## 2023-01-19 DIAGNOSIS — Z9889 Other specified postprocedural states: Secondary | ICD-10-CM | POA: Insufficient documentation

## 2023-01-20 ENCOUNTER — Ambulatory Visit (INDEPENDENT_AMBULATORY_CARE_PROVIDER_SITE_OTHER): Payer: 59 | Admitting: Orthopedic Surgery

## 2023-01-20 ENCOUNTER — Encounter: Payer: Self-pay | Admitting: Orthopedic Surgery

## 2023-01-20 DIAGNOSIS — S83241A Other tear of medial meniscus, current injury, right knee, initial encounter: Secondary | ICD-10-CM

## 2023-01-20 DIAGNOSIS — Z9889 Other specified postprocedural states: Secondary | ICD-10-CM

## 2023-01-20 NOTE — Progress Notes (Signed)
Chief Complaint  Patient presents with   Post-op Follow-up    Meniscus repair 01/15/23    Encounter Diagnoses  Name Primary?   S/P medial meniscus repair of right knee 01/15/23 Yes   Other tear of medial meniscus of right knee as current injury, initial encounter     Status post medial meniscal repair  Of Procedure arthroscopy medial meniscus repair of the body and partial medial meniscectomy posterior horn the body and resection of the posterior horn  Patient placed in 0-90 hinged knee brace  Full weightbearing  Continue active range of motion and ice  Come back in 1 week to remove all sutures

## 2023-01-21 ENCOUNTER — Encounter (HOSPITAL_COMMUNITY): Payer: Self-pay | Admitting: Orthopedic Surgery

## 2023-01-21 NOTE — Progress Notes (Signed)
Noah Cherry is an 52 y.o. male.       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  He says he was doing some work around the house woke up the next day had medial knee pain.  [facility] they diagnosed him with quadriceps tendinitis put him on diclofenac which he took for a week.  This did not help   His pain is actually along the medial joint line he cannot really extend the knee it is hard for him to drive the truck and work the gas pedal   He has lack of confidence in the knee in terms of weightbearing         Past Medical History:  Diagnosis Date   Arthritis     GERD (gastroesophageal reflux disease)     High cholesterol     Pneumonia             Past Surgical History:  Procedure Laterality Date   APPENDECTOMY       COLONOSCOPY N/A 06/01/2014    Procedure: COLONOSCOPY;  Surgeon: Rogene Houston, MD;  Location: [facility];  Service: Endoscopy;  Laterality: N/A;  100-moved to 200 Ann to notify pt   ESOPHAGOGASTRODUODENOSCOPY N/A 06/01/2014    Procedure: ESOPHAGOGASTRODUODENOSCOPY (EGD);  Surgeon: Rogene Houston, MD;  Location: [facility];  Service: Endoscopy;  Laterality: N/A;   left arm surgery        laceration   Right ankle surgery Right 2021      History reviewed. No pertinent family history. Social History:  reports that he has been smoking cigarettes. He has been smoking an average of 1 pack per day. His smokeless tobacco use includes chew. He reports current alcohol use of about 24.0 standard drinks of alcohol per week. He reports that he does not use drugs.   Allergies: No Known Allergies   No medications prior to admission.      Lab Results Last 48 Hours  No results found for this or any previous visit (from the past 48 hour(s)).   Imaging Results (Last 48 hours)  No results found.     Review of Systems  Constitutional: Negative.    Respiratory: Negative.    Cardiovascular: Negative.       There were no vitals taken for this visit. Physical Exam Constitutional:      General: He is not in acute distress.    Appearance: Normal appearance. He is not ill-appearing, toxic-appearing or diaphoretic.  HENT:     Head: Normocephalic.     Nose: Nose normal.     Mouth/Throat:     Mouth: Mucous membranes are moist.  Eyes:     General: No scleral icterus.    Extraocular Movements: Extraocular movements intact.     Pupils: Pupils are equal, round, and reactive to light.  Cardiovascular:     Rate and Rhythm: Normal rate.     Pulses: Normal pulses.  Pulmonary:     Effort: Pulmonary effort is normal.     Breath sounds: Normal breath sounds.  Abdominal:     General: Abdomen is flat.  Musculoskeletal:     Cervical back: Normal range of motion.  Skin:    General: Skin is warm and dry.     Capillary Refill: Capillary refill takes less than 2 seconds.  Neurological:     General:  No focal deficit present.     Mental Status: He is alert and oriented to person, place, and time.  Psychiatric:        Mood and Affect: Mood normal.        Behavior: Behavior normal.        Thought Content: Thought content normal.        Judgment: Judgment normal.      + EXAM FINDINGS: Musculoskeletal: His gait is altered he is favoring the right knee   He is tender over the medial joint line he has a 10 degree block to extension he flexes the knee fine twisting the knee and McMurray's reproduces pain   Assessment/Plan     MRI REPORT:    IMPRESSION: 1. Oblique undersurface tear extending through the inferior articular surface of the middle and central thirds of the meniscal triangle of the posterior aspect of the body of the medial meniscus. There is a flipped fragment of meniscal tissue from the peripheral inferior aspect of the meniscal triangle into the medial gutter, measuring up to 17 mm in AP dimension. 2. Additional  degenerative fraying of the central third of the meniscal triangle of the posterior horn of the medial meniscus near the root. Partial-thickness cartilage loss of the trochlear notch. 3. Thinning of the far medial aspect of the weight-bearing medial femoral condyle and medial tibial plateau cartilage. Mild-to-moderate subchondral marrow edema within the far medial aspect of the medial tibial plateau.     Torn medial meniscus left knee   Arthroscopy right knee partial medial meniscectomy   Arther Abbott, MD 01/14/2023, 1:57 PM

## 2023-01-28 ENCOUNTER — Encounter: Payer: Self-pay | Admitting: Orthopedic Surgery

## 2023-01-28 ENCOUNTER — Ambulatory Visit (INDEPENDENT_AMBULATORY_CARE_PROVIDER_SITE_OTHER): Payer: 59 | Admitting: Orthopedic Surgery

## 2023-01-28 DIAGNOSIS — Z9889 Other specified postprocedural states: Secondary | ICD-10-CM

## 2023-01-28 NOTE — Progress Notes (Signed)
Chief Complaint  Patient presents with   Post-op Follow-up    01/15/23 right knee meniscus repair    Encounter Diagnosis  Name Primary?   S/P medial meniscus repair of right knee 01/15/23 Yes   Just he had partial meniscectomy and then meniscal repair of the body.  He is in a knee brace 0 to 90 degrees full weightbearing  I added some exercises for him to strengthen his quads.  Continue limitation 0 to 90 degrees in brace  Sutures were removed wound looks fine follow-up in 2 weeks advance brace at that time  Expected time out of work 6 weeks starting February 2  Encounter Diagnosis  Name Primary?   S/P medial meniscus repair of right knee 01/15/23 Yes

## 2023-01-28 NOTE — Patient Instructions (Signed)
NEEDS OOW FROM 01/15/23 UNTIL 02/26/23

## 2023-02-09 ENCOUNTER — Telehealth: Payer: Self-pay | Admitting: Orthopedic Surgery

## 2023-02-09 NOTE — Telephone Encounter (Signed)
[employer] forms received. To [employer].

## 2023-02-11 ENCOUNTER — Ambulatory Visit (INDEPENDENT_AMBULATORY_CARE_PROVIDER_SITE_OTHER): Payer: 59 | Admitting: Orthopedic Surgery

## 2023-02-11 ENCOUNTER — Encounter: Payer: Self-pay | Admitting: Orthopedic Surgery

## 2023-02-11 DIAGNOSIS — Z9889 Other specified postprocedural states: Secondary | ICD-10-CM

## 2023-02-11 NOTE — Progress Notes (Signed)
Chief Complaint  Patient presents with   Follow-up    Recheck on right knee, DOS 01-15-23.   Status post arthroscopy partial medial meniscectomy and partial meniscal repair right knee  Brace currently 0-90  Patient complains of intermittent sharp pain in his right knee with occasional feeling like the knee is going to give way forward  He does have an effusion he is tender on the medial side he seems to have some synovitis  His range of motion is 2-115 degrees  I activated his brace from 90 to 120 degrees he will continue quadricep strengthening and follow-up with me in 3 weeks.  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

## 2023-02-11 NOTE — Patient Instructions (Signed)
OOW NOTE FOR 3 MORE WEEKS

## 2023-03-04 ENCOUNTER — Encounter: Payer: Self-pay | Admitting: Orthopedic Surgery

## 2023-03-04 ENCOUNTER — Ambulatory Visit (INDEPENDENT_AMBULATORY_CARE_PROVIDER_SITE_OTHER): Payer: 59 | Admitting: Orthopedic Surgery

## 2023-03-04 DIAGNOSIS — Z9889 Other specified postprocedural states: Secondary | ICD-10-CM

## 2023-03-04 NOTE — Patient Instructions (Signed)
OOW X 8 WEEKS

## 2023-03-04 NOTE — Progress Notes (Signed)
Postop appointment   Chief Complaint  Patient presents with   Post-op Follow-up    01/15/23 right knee arthroscopy /improving     Encounter Diagnosis  Name Primary?   S/P medial meniscus repair of right knee 01/15/23 Yes    Status post medial meniscal repair and partial resection   48 days postop   He continues to improve but he is having some weakness on declines and on exam he has tenderness at his quadriceps patellar insertion with some weakness and pain with full extension   The repair site is tender to touch but is not having any pain with weightbearing   I recommend physical therapy to improve his quadriceps function  Out of work for 8 weeks  Follow-up in 4 weeks

## 2023-03-22 ENCOUNTER — Telehealth: Payer: Self-pay | Admitting: Orthopedic Surgery

## 2023-03-22 NOTE — Telephone Encounter (Signed)
Dr. Mort Sawyers pt - spoke with the pt's spouse, she called and stated that the patient has never gotten a call to setup PT, but he has been doing exercises at home, doing really good and can bend it w/out it hurting.  He really doesn't think he needs PT now.  He wants to come to his appt on 04/01/23 and discuss with Dr. Romeo Apple at that time.  Told her I would pass this information along and see what Dr. Romeo Apple thinks.

## 2023-03-22 NOTE — Telephone Encounter (Signed)
Ok t

## 2023-04-01 ENCOUNTER — Ambulatory Visit (INDEPENDENT_AMBULATORY_CARE_PROVIDER_SITE_OTHER): Payer: 59 | Admitting: Orthopedic Surgery

## 2023-04-01 ENCOUNTER — Encounter: Payer: Self-pay | Admitting: Orthopedic Surgery

## 2023-04-01 DIAGNOSIS — Z9889 Other specified postprocedural states: Secondary | ICD-10-CM

## 2023-04-01 NOTE — Progress Notes (Signed)
Chief Complaint  Patient presents with   Post-op Follow-up    Right knee scope    Knee arthroscopy right knee partial medial meniscectomy and meniscal repair  January 15, 2023  "It feels great"  The patient is doing well never got his therapy that we ordered but he did the therapy on his own  He says his knee feels great.  He says he has been doing some squatting to test the knee and that felt good  He has full extension full flexion negative McMurray's no joint line tenderness no effusion knee feels really stable he walks normally  He can return to work on the 29th will do 1 more week of home exercises to build up some more quad strength to get ready for work  Follow-up with [place] as needed

## 2023-04-01 NOTE — Patient Instructions (Signed)
May return to work on 29 April

## 2024-01-25 ENCOUNTER — Other Ambulatory Visit (HOSPITAL_COMMUNITY): Payer: Self-pay
